# Patient Record
Sex: Male | Born: 1980 | Race: White | Hispanic: No | Marital: Married | Smoking: Former smoker
Health system: Southern US, Community
[De-identification: ages and names within clinical notes are randomized; demographics above are authoritative.]

## PROBLEM LIST (undated history)

## (undated) DIAGNOSIS — F840 Autistic disorder: Secondary | ICD-10-CM

## (undated) DIAGNOSIS — R7303 Prediabetes: Secondary | ICD-10-CM

## (undated) DIAGNOSIS — E119 Type 2 diabetes mellitus without complications: Secondary | ICD-10-CM

## (undated) DIAGNOSIS — Z9889 Other specified postprocedural states: Secondary | ICD-10-CM

## (undated) DIAGNOSIS — G4730 Sleep apnea, unspecified: Secondary | ICD-10-CM

## (undated) DIAGNOSIS — R112 Nausea with vomiting, unspecified: Secondary | ICD-10-CM

## (undated) DIAGNOSIS — I10 Essential (primary) hypertension: Secondary | ICD-10-CM

## (undated) DIAGNOSIS — R7989 Other specified abnormal findings of blood chemistry: Secondary | ICD-10-CM

## (undated) DIAGNOSIS — E221 Hyperprolactinemia: Secondary | ICD-10-CM

## (undated) DIAGNOSIS — F909 Attention-deficit hyperactivity disorder, unspecified type: Secondary | ICD-10-CM

## (undated) DIAGNOSIS — N200 Calculus of kidney: Secondary | ICD-10-CM

## (undated) DIAGNOSIS — K8020 Calculus of gallbladder without cholecystitis without obstruction: Secondary | ICD-10-CM

## (undated) DIAGNOSIS — K760 Fatty (change of) liver, not elsewhere classified: Secondary | ICD-10-CM

## (undated) DIAGNOSIS — E230 Hypopituitarism: Secondary | ICD-10-CM

## (undated) DIAGNOSIS — R000 Tachycardia, unspecified: Secondary | ICD-10-CM

## (undated) DIAGNOSIS — N62 Hypertrophy of breast: Secondary | ICD-10-CM

## (undated) DIAGNOSIS — F32A Depression, unspecified: Secondary | ICD-10-CM

## (undated) HISTORY — DX: Attention-deficit hyperactivity disorder, unspecified type: F90.9

## (undated) HISTORY — DX: Calculus of gallbladder without cholecystitis without obstruction: K80.20

## (undated) HISTORY — DX: Fatty (change of) liver, not elsewhere classified: K76.0

## (undated) HISTORY — PX: SMALL INTESTINE SURGERY: SHX150

## (undated) HISTORY — DX: Hyperprolactinemia: E22.1

## (undated) HISTORY — DX: Sleep apnea, unspecified: G47.30

## (undated) HISTORY — DX: Depression, unspecified: F32.A

## (undated) HISTORY — DX: Essential (primary) hypertension: I10

## (undated) HISTORY — DX: Hypertrophy of breast: N62

## (undated) HISTORY — DX: Other specified abnormal findings of blood chemistry: R79.89

## (undated) HISTORY — DX: Calculus of kidney: N20.0

## (undated) HISTORY — PX: CHOLECYSTECTOMY: SHX55

## (undated) HISTORY — DX: Autistic disorder: F84.0

## (undated) HISTORY — DX: Morbid (severe) obesity due to excess calories: E66.01

## (undated) HISTORY — DX: Hypopituitarism: E23.0

## (undated) HISTORY — DX: Type 2 diabetes mellitus without complications: E11.9

## (undated) HISTORY — DX: Prediabetes: R73.03

## (undated) HISTORY — DX: Tachycardia, unspecified: R00.0

---

## 2002-01-17 LAB — HM HEPATITIS C SCREENING LAB: HM Hepatitis Screen: NEGATIVE

## 2002-01-17 LAB — HM HIV SCREENING LAB: HM HIV Screening: NEGATIVE

## 2015-07-13 DIAGNOSIS — Z1389 Encounter for screening for other disorder: Secondary | ICD-10-CM | POA: Diagnosis not present

## 2015-07-13 DIAGNOSIS — F330 Major depressive disorder, recurrent, mild: Secondary | ICD-10-CM | POA: Diagnosis not present

## 2015-07-13 DIAGNOSIS — K219 Gastro-esophageal reflux disease without esophagitis: Secondary | ICD-10-CM | POA: Diagnosis not present

## 2015-08-11 DIAGNOSIS — F330 Major depressive disorder, recurrent, mild: Secondary | ICD-10-CM | POA: Diagnosis not present

## 2015-08-11 DIAGNOSIS — Z79899 Other long term (current) drug therapy: Secondary | ICD-10-CM | POA: Diagnosis not present

## 2015-08-19 DIAGNOSIS — Z0000 Encounter for general adult medical examination without abnormal findings: Secondary | ICD-10-CM | POA: Diagnosis not present

## 2015-08-19 DIAGNOSIS — Z1389 Encounter for screening for other disorder: Secondary | ICD-10-CM | POA: Diagnosis not present

## 2015-08-19 DIAGNOSIS — R5383 Other fatigue: Secondary | ICD-10-CM | POA: Diagnosis not present

## 2015-08-19 DIAGNOSIS — E669 Obesity, unspecified: Secondary | ICD-10-CM | POA: Diagnosis not present

## 2015-08-19 DIAGNOSIS — F4322 Adjustment disorder with anxiety: Secondary | ICD-10-CM | POA: Diagnosis not present

## 2015-09-08 DIAGNOSIS — R5383 Other fatigue: Secondary | ICD-10-CM | POA: Diagnosis not present

## 2015-09-08 DIAGNOSIS — F330 Major depressive disorder, recurrent, mild: Secondary | ICD-10-CM | POA: Diagnosis not present

## 2015-09-08 DIAGNOSIS — E291 Testicular hypofunction: Secondary | ICD-10-CM | POA: Diagnosis not present

## 2015-09-08 DIAGNOSIS — K219 Gastro-esophageal reflux disease without esophagitis: Secondary | ICD-10-CM | POA: Diagnosis not present

## 2015-09-12 DIAGNOSIS — E291 Testicular hypofunction: Secondary | ICD-10-CM | POA: Diagnosis not present

## 2015-09-20 DIAGNOSIS — F419 Anxiety disorder, unspecified: Secondary | ICD-10-CM | POA: Diagnosis not present

## 2015-09-22 DIAGNOSIS — M25571 Pain in right ankle and joints of right foot: Secondary | ICD-10-CM | POA: Diagnosis not present

## 2015-10-11 DIAGNOSIS — F331 Major depressive disorder, recurrent, moderate: Secondary | ICD-10-CM | POA: Diagnosis not present

## 2015-10-26 DIAGNOSIS — E291 Testicular hypofunction: Secondary | ICD-10-CM | POA: Diagnosis not present

## 2016-02-08 DIAGNOSIS — E291 Testicular hypofunction: Secondary | ICD-10-CM | POA: Diagnosis not present

## 2016-02-08 DIAGNOSIS — R05 Cough: Secondary | ICD-10-CM | POA: Diagnosis not present

## 2016-02-08 DIAGNOSIS — F330 Major depressive disorder, recurrent, mild: Secondary | ICD-10-CM | POA: Diagnosis not present

## 2016-02-08 DIAGNOSIS — R5383 Other fatigue: Secondary | ICD-10-CM | POA: Diagnosis not present

## 2016-02-08 DIAGNOSIS — K219 Gastro-esophageal reflux disease without esophagitis: Secondary | ICD-10-CM | POA: Diagnosis not present

## 2016-02-08 DIAGNOSIS — J029 Acute pharyngitis, unspecified: Secondary | ICD-10-CM | POA: Diagnosis not present

## 2016-02-08 DIAGNOSIS — M79672 Pain in left foot: Secondary | ICD-10-CM | POA: Diagnosis not present

## 2016-11-28 DIAGNOSIS — K219 Gastro-esophageal reflux disease without esophagitis: Secondary | ICD-10-CM | POA: Diagnosis not present

## 2016-11-28 DIAGNOSIS — Z79899 Other long term (current) drug therapy: Secondary | ICD-10-CM | POA: Diagnosis not present

## 2016-11-28 DIAGNOSIS — E291 Testicular hypofunction: Secondary | ICD-10-CM | POA: Diagnosis not present

## 2016-11-28 DIAGNOSIS — F330 Major depressive disorder, recurrent, mild: Secondary | ICD-10-CM | POA: Diagnosis not present

## 2016-12-07 DIAGNOSIS — M7710 Lateral epicondylitis, unspecified elbow: Secondary | ICD-10-CM | POA: Diagnosis not present

## 2017-03-01 DIAGNOSIS — K219 Gastro-esophageal reflux disease without esophagitis: Secondary | ICD-10-CM | POA: Diagnosis not present

## 2017-03-01 DIAGNOSIS — Z79899 Other long term (current) drug therapy: Secondary | ICD-10-CM | POA: Diagnosis not present

## 2017-03-01 DIAGNOSIS — F330 Major depressive disorder, recurrent, mild: Secondary | ICD-10-CM | POA: Diagnosis not present

## 2017-03-01 DIAGNOSIS — J329 Chronic sinusitis, unspecified: Secondary | ICD-10-CM | POA: Diagnosis not present

## 2017-03-01 DIAGNOSIS — E291 Testicular hypofunction: Secondary | ICD-10-CM | POA: Diagnosis not present

## 2018-07-24 DIAGNOSIS — K760 Fatty (change of) liver, not elsewhere classified: Secondary | ICD-10-CM | POA: Diagnosis not present

## 2018-07-24 DIAGNOSIS — I10 Essential (primary) hypertension: Secondary | ICD-10-CM | POA: Diagnosis not present

## 2018-07-24 DIAGNOSIS — J9811 Atelectasis: Secondary | ICD-10-CM | POA: Diagnosis not present

## 2018-07-24 DIAGNOSIS — R002 Palpitations: Secondary | ICD-10-CM | POA: Diagnosis not present

## 2018-07-24 DIAGNOSIS — R0602 Shortness of breath: Secondary | ICD-10-CM | POA: Diagnosis not present

## 2018-07-24 DIAGNOSIS — R0600 Dyspnea, unspecified: Secondary | ICD-10-CM | POA: Diagnosis not present

## 2018-07-24 DIAGNOSIS — K219 Gastro-esophageal reflux disease without esophagitis: Secondary | ICD-10-CM | POA: Diagnosis not present

## 2018-07-24 DIAGNOSIS — I4892 Unspecified atrial flutter: Secondary | ICD-10-CM | POA: Diagnosis not present

## 2018-07-28 DIAGNOSIS — I4892 Unspecified atrial flutter: Secondary | ICD-10-CM | POA: Diagnosis not present

## 2018-07-28 DIAGNOSIS — I517 Cardiomegaly: Secondary | ICD-10-CM

## 2018-07-29 DIAGNOSIS — I4892 Unspecified atrial flutter: Secondary | ICD-10-CM | POA: Diagnosis not present

## 2018-07-29 DIAGNOSIS — Z0000 Encounter for general adult medical examination without abnormal findings: Secondary | ICD-10-CM | POA: Diagnosis not present

## 2018-07-29 DIAGNOSIS — G4730 Sleep apnea, unspecified: Secondary | ICD-10-CM | POA: Diagnosis not present

## 2018-08-06 ENCOUNTER — Telehealth: Payer: Self-pay

## 2018-08-06 NOTE — Telephone Encounter (Signed)
Called patient to schedule new patient appt due to atrial flutter. Patient works nights and was instructed to call office back later today.

## 2018-08-14 NOTE — Progress Notes (Signed)
Cardiology Office Note:    Date:  08/15/2018   ID:  Peter Gutierrez, DOB 03/12/1980, MRN [id number]  PCP:  Peter CorporalYates, Kate H, PA  Cardiologist:  Peter HerrlichBrian Imani Sherrin, MD   Referring MD: Peter Gutierrez  ASSESSMENT:    1. Atrial flutter, unspecified type (HCC)   2. Cardiomyopathy, unspecified type (HCC)   3. BMI 45.0-49.9, adult (HCC)   4. Essential hypertension   5. Obstructive sleep apnea syndrome    PLAN:   I was able fortunately to access the office EKG independently reviewed it is sinus tachycardia otherwise normal the computer over read was indeed atrial flutter I discussed with patient that I do not think he has a primary problem with his heart rhythm I do not see any indication for ambulatory heart rhythm monitors antiarrhythmic drugs anticoagulation I think she remain on his beta-blocker which helps with hypertension control.  He voices understanding and agrees.  In order of problems listed above:  1. Plan trying to do more than anything else is obtained that initial EKG I doubt whether he had atrial flutter at this time I continue beta-blocker and I do not see any indication to consider antiarrhythmic drug anticoagulation refer to EP but I am doing my best to have the EKG in the office before the patient leaves. 2. I am unsure about the diagnosis of cardiomyopathy with the limits of his echocardiogram with morbid obesity repeat in my office with contrast.  Check proBNP level with next labs 3. He has morbid obesity with hypertension and sleep apnea would benefit from bariatric surgery I engaged in the discussion and told him that if medications do not work it is an appropriate treatment. 4. He has stage II hypertension and ARB 1 week check renal function potassium proBNP level 5. Untreated sleep apnea may well be responsible for all or most of his symptoms including fatigue edema exertional shortness of breath and new onset of hypertension.  Strongly encouraged him to reinitiate positive  pressure  Next appointment 4 weeks   Medication Adjustments/Labs and Tests Ordered: Current medicines are reviewed at length with the patient today.  Concerns regarding medicines are outlined above.  Orders Placed This Encounter  Procedures  . Basic Metabolic Panel (BMET)  . Pro b natriuretic peptide  . EKG 12-Lead  . ECHOCARDIOGRAM COMPLETE   Meds ordered this encounter  Medications  . telmisartan (MICARDIS) 20 MG tablet    Sig: Take 1 tablet (20 mg total) by mouth daily.    Dispense:  90 tablet    Refill:  1     Chief Complaint  Patient presents with  . Palpitations  . Abnormal ECG    " atrial flutter"  Chief Complaint: 38 yo male presents for consultation of atrial flutter.   History of Present Illness:    Peter Gutierrez is a 38 y.o. male who is being seen today for the evaluation of atrial flutter at the request of Peter Gutierrez. He has a past medical history of hypertension, sleep apnea (not on CPAP), obesity, and fatty liver. He has a strong family history of cardiovascular disease.   Seen by his PCP 07/24/18 for uncontrolled hypertension and palpitations. His EKG showed atrial flutter and he was sent to [facility] ED. That EKG unfortunately is unavailable for my review. In the ED: CTA negative for PE. CXR with possible atelectasis. Galbladder/liver ultrasound with enlarged fatty liver. EKG sinus tachycardia, independently reviewed by me. Seen again in PCP office 08/05/18.  Echo 07/28/18 with EF 45-50%, mild LVH, mild global hypokinesis of LV, mild AV sclerosis, mild MV calcification, trace MR/TR.   He is not doing well on life he finds himself quite fatigued he has severe sleep apnea and he has not started treatment because of cost.  He has elevated blood pressure but no diagnosis of hypertension and today's stage to my office and I will initiate an ARB in addition to his beta-blocker.  At times he notices his heart rate in the range of 110-120 and some palpitation  but not severe sustained was put on a beta-blocker and is markedly improved.  He short of breath when he climbs a few flights of stairs but not when he does physical labor he has edema at the end of the day but no orthopnea no chest pain or syncope.  He has no known history of congenital or rheumatic heart disease.  I reviewed his testing from [facility] at the end of the echo report it really says it was a difficult study to interpret and advised another means to assess left ventricular function.  In view of his body mass we will do an echocardiogram with contrast and I have him sitting in my office waiting to get the EKG that was reported as atrial flutter.  He also has a history of asthma but presently is not using bronchodilators and is on Trulicity for weight loss and what he reports to me is prediabetes  Past Medical History:  Diagnosis Date  . Fatty liver   . Hypertension   . Morbid obesity (HCC)   . Sleep apnea     Past Surgical History:  Procedure Laterality Date  . CHOLECYSTECTOMY      Current Medications: Current Meds  Medication Sig  . meloxicam (MOBIC) 15 MG tablet TAKE 1 TABLET BY MOUTH EVERY DAY AS NEEDED FOR PAIN  . metoprolol succinate (TOPROL-XL) 50 MG 24 hr tablet Take 50 mg by mouth daily.  . TRULICITY 0.75 MG/0.5ML SOPN 1 (ONE) SOLUTION PEN INJECTOR WEEKLY     Allergies:   Patient has no known allergies.   Social History   Socioeconomic History  . Marital status: Unknown    Spouse name: Not on file  . Number of children: Not on file  . Years of education: Not on file  . Highest education level: Not on file  Occupational History  . Not on file  Social Needs  . Financial resource strain: Not on file  . Food insecurity    Worry: Not on file    Inability: Not on file  . Transportation needs    Medical: Not on file    Non-medical: Not on file  Tobacco Use  . Smoking status: Former Smoker    Packs/day: 0.50    Years: 7.00    Pack years: 3.50     Types: Cigarettes    Quit date: 2011    Years since quitting: 9.5  . Smokeless tobacco: Current User  Substance and Sexual Activity  . Alcohol use: Yes    Comment: 7 beers per week  . Drug use: Not Currently  . Sexual activity: Not on file  Lifestyle  . Physical activity    Days per week: Not on file    Minutes per session: Not on file  . Stress: Not on file  Relationships  . Social Musicianconnections    Talks on phone: Not on file    Gets together: Not on file    Attends religious  service: Not on file    Active member of club or organization: Not on file    Attends meetings of clubs or organizations: Not on file    Relationship status: Not on file  Other Topics Concern  . Not on file  Social History Narrative  . Not on file     Family History: The patient's family history includes Arthritis in his father; Colon cancer in his maternal grandfather; Diabetes in his father and mother; Heart attack in his father and maternal grandfather; Heart disease in his father and maternal grandfather; Hyperlipidemia in his father and maternal grandfather; Hypertension in his father and maternal grandfather.  ROS:   Review of Systems  Constitution: Positive for malaise/fatigue.  HENT: Negative.   Eyes: Negative.   Cardiovascular: Positive for palpitations.  Respiratory: Positive for shortness of breath and snoring.   Endocrine: Negative.   Hematologic/Lymphatic: Negative.   Skin: Negative.   Musculoskeletal: Negative.   Gastrointestinal: Negative.   Genitourinary: Negative.   Neurological: Negative.   Psychiatric/Behavioral: Negative.   Allergic/Immunologic: Negative.    Please see the history of present illness.     All other systems reviewed and are negative.  EKGs/Labs/Other Studies Reviewed:    The following studies were reviewed today: CTA 08-22-18 - No PE. No cardiac abnormalities.  Galbladder [place] 08-22-2018 - Enlarged fatty liver. Cholecystectomy. Echocardiogram 08-22-18 CXR 08-22-2018  - "Minimal left basilar subsegmental atelectasis. No other active cardiopulmonary disease"  EKG:  EKG is  ordered today.  The ekg ordered today is personally reviewed and demonstrates shows sinus rhythm and is normal  Recent Labs:  08/05/18 creatinine 0.69, GFR 120, K 5.1, Na 145, Ca 9.4, AST 92, ALT 100, Hb 15.3A1c 6.2,  Recent Lipid Panel 08/05/18 total 104 HDL 38 LDL 54 Triglycerides 61  Physical Exam:    VS:  BP (!) 152/110 (BP Location: Left Arm, Patient Position: Sitting, Cuff Size: Large)   Pulse 85   Temp 98.4 F (36.9 C)   Ht 6\' 3"  (1.905 m)   Wt (!) 373 lb 3.2 oz (169.3 kg)   SpO2 97%   BMI 46.65 kg/m     Wt Readings from Last 3 Encounters:  08/15/18 (!) 373 lb 3.2 oz (169.3 kg)     GEN: Morbid obesity but in general he is just a big man well nourished, well developed in no acute distress HEENT: Normal NECK: No JVD; No carotid bruits LYMPHATICS: No lymphadenopathy CARDIAC: RRR, no murmurs, rubs, gallops RESPIRATORY:  Clear to auscultation without rales, wheezing or rhonchi  ABDOMEN: Soft, non-tender, non-distended MUSCULOSKELETAL:  No edema; No deformity  SKIN: Warm and dry NEUROLOGIC:  Alert and oriented x 3 PSYCHIATRIC:  Normal affect     Signed, Shirlee More, MD  08/15/2018 11:20 AM    [place]

## 2018-08-15 ENCOUNTER — Ambulatory Visit (INDEPENDENT_AMBULATORY_CARE_PROVIDER_SITE_OTHER): Payer: Managed Care, Other (non HMO) | Admitting: Cardiology

## 2018-08-15 ENCOUNTER — Other Ambulatory Visit: Payer: Self-pay

## 2018-08-15 ENCOUNTER — Encounter: Payer: Self-pay | Admitting: Cardiology

## 2018-08-15 VITALS — BP 152/110 | HR 85 | Temp 98.4°F | Ht 75.0 in | Wt 373.2 lb

## 2018-08-15 DIAGNOSIS — G4730 Sleep apnea, unspecified: Secondary | ICD-10-CM | POA: Insufficient documentation

## 2018-08-15 DIAGNOSIS — I429 Cardiomyopathy, unspecified: Secondary | ICD-10-CM | POA: Diagnosis not present

## 2018-08-15 DIAGNOSIS — I10 Essential (primary) hypertension: Secondary | ICD-10-CM | POA: Diagnosis not present

## 2018-08-15 DIAGNOSIS — G4733 Obstructive sleep apnea (adult) (pediatric): Secondary | ICD-10-CM

## 2018-08-15 DIAGNOSIS — I4892 Unspecified atrial flutter: Secondary | ICD-10-CM | POA: Diagnosis not present

## 2018-08-15 DIAGNOSIS — Z6841 Body Mass Index (BMI) 40.0 and over, adult: Secondary | ICD-10-CM | POA: Diagnosis not present

## 2018-08-15 MED ORDER — TELMISARTAN 20 MG PO TABS: 20.0000 mg | ORAL_TABLET | Freq: Every day | ORAL | 1 refills | Status: DC

## 2018-08-15 NOTE — Patient Instructions (Addendum)
Medication Instructions:  Your physician has recommended you make the following change in your medication:   START: Telmisartan 20 mg: Take 1 tab daily   If you need a refill on your cardiac medications before your next appointment, please call your pharmacy.   Lab work: Your physician recommends that you return for lab work in: 1 week BMP,Pro BNP  If you have labs (blood work) drawn today and your tests are completely normal, you will receive your results only by: Marland Kitchen. MyChart Message (if you have MyChart) OR . A paper copy in the mail If you have any lab test that is abnormal or we need to change your treatment, we will call you to review the results.  Testing/Procedures: Your physician has requested that you have an echocardiogram. Echocardiography is a painless test that uses sound waves to create images of your heart. It provides your doctor with information about the size and shape of your heart and how well your heart's chambers and valves are working. This procedure takes approximately one hour. There are no restrictions for this procedure.    Follow-Up: At [facility], you and your health needs are our priority.  As part of our continuing mission to provide you with exceptional heart care, we have created designated Provider Care Teams.  These Care Teams include your primary Cardiologist (physician) and Advanced Practice Providers (APPs -  Physician Assistants and Nurse Practitioners) who all work together to provide you with the care you need, when you need it. You will need a follow up appointment in 4 weeks.   Any Other Special Instructions Will Be Listed Below (If Applicable).   Echocardiogram An echocardiogram is a procedure that uses painless sound waves (ultrasound) to produce an image of the heart. Images from an echocardiogram can provide important information about:  Signs of coronary artery disease (CAD).  Aneurysm detection. An aneurysm is a weak or damaged part  of an artery wall that bulges out from the normal force of blood pumping through the body.  Heart size and shape. Changes in the size or shape of the heart can be associated with certain conditions, including heart failure, aneurysm, and CAD.  Heart muscle function.  Heart valve function.  Signs of a past heart attack.  Fluid buildup around the heart.  Thickening of the heart muscle.  A tumor or infectious growth around the heart valves. Tell a health care provider about:  Any allergies you have.  All medicines you are taking, including vitamins, herbs, eye drops, creams, and over-the-counter medicines.  Any blood disorders you have.  Any surgeries you have had.  Any medical conditions you have.  Whether you are pregnant or may be pregnant. What are the risks? Generally, this is a safe procedure. However, problems may occur, including:  Allergic reaction to dye (contrast) that may be used during the procedure. What happens before the procedure? No specific preparation is needed. You may eat and drink normally. What happens during the procedure?   An IV tube may be inserted into one of your veins.  You may receive contrast through this tube. A contrast is an injection that improves the quality of the pictures from your heart.  A gel will be applied to your chest.  A wand-like tool (transducer) will be moved over your chest. The gel will help to transmit the sound waves from the transducer.  The sound waves will harmlessly bounce off of your heart to allow the heart images to be captured in  real-time motion. The images will be recorded on a computer. The procedure may vary among health care providers and hospitals. What happens after the procedure?  You may return to your normal, everyday life, including diet, activities, and medicines, unless your health care provider tells you not to do that. Summary  An echocardiogram is a procedure that uses painless sound waves  (ultrasound) to produce an image of the heart.  Images from an echocardiogram can provide important information about the size and shape of your heart, heart muscle function, heart valve function, and fluid buildup around your heart.  You do not need to do anything to prepare before this procedure. You may eat and drink normally.  After the echocardiogram is completed, you may return to your normal, everyday life, unless your health care provider tells you not to do that. This information is not intended to replace advice given to you by your health care provider. Make sure you discuss any questions you have with your health care provider. Document Released: 01/13/2000 Document Revised: 05/08/2018 Document Reviewed: 02/18/2016 Elsevier Patient Education  [place].  Telmisartan tablets What is this medicine? TELMISARTAN (tel mi SAR tan) is used to treat high blood pressure. This medicine may be used for other purposes; ask your health care provider or pharmacist if you have questions. COMMON BRAND NAME(S): Micardis What should I tell my health care provider before I take this medicine? They need to know if you have any of these conditions:  if you are on a special diet, such as a low-salt diet  kidney or liver disease  an unusual or allergic reaction to telmisartan, other medicines, foods, dyes, or preservatives  pregnant or trying to get pregnant  breast-feeding How should I use this medicine? Take this medicine by mouth with a glass of water. Follow the directions on the prescription label. This medicine can be taken with or without food. Take your doses at regular intervals. Do not take your medicine more often than directed. Talk to your pediatrician regarding the use of this medicine in children. Special care may be needed. Overdosage: If you think you have taken too much of this medicine contact a poison control center or emergency room at once. NOTE: This medicine is  only for you. Do not share this medicine with others. What if I miss a dose? If you miss a dose, take it as soon as you can. If it is almost time for your next dose, take only that dose. Do not take double or extra doses. What may interact with this medicine?  digoxin  potassium salts or potassium supplements  warfarin This list may not describe all possible interactions. Give your health care provider a list of all the medicines, herbs, non-prescription drugs, or dietary supplements you use. Also tell them if you smoke, drink alcohol, or use illegal drugs. Some items may interact with your medicine. What should I watch for while using this medicine? Visit your doctor or health care professional for regular checks on your progress. Check your blood pressure as directed. Ask your doctor or health care professional what your blood pressure should be and when you should contact him or her. Call your doctor or health care professional if you notice an irregular or fast heart beat. Women should inform their doctor if they wish to become pregnant or think they might be pregnant. There is a potential for serious side effects to an unborn child, particularly in the second or third trimester. Talk to  your health care professional or pharmacist for more information. You may get drowsy or dizzy. Do not drive, use machinery, or do anything that needs mental alertness until you know how this drug affects you. Do not stand or sit up quickly, especially if you are an older patient. This reduces the risk of dizzy or fainting spells. Alcohol can make you more drowsy and dizzy. Avoid alcoholic drinks. Avoid salt substitutes unless you are told otherwise by your doctor or health care professional. Do not treat yourself for coughs, colds, or pain while you are taking this medicine without asking your doctor or health care professional for advice. Some ingredients may increase your blood pressure. What side effects may  I notice from receiving this medicine? Side effects that you should report to your doctor or health care professional as soon as possible:  allergic reactions like skin rash, itching or hives, swelling of the face, lips, or tongue  breathing problems  dark urine  gout pain  muscle pains  slow heartbeat  trouble passing urine or change in the amount of urine  unusual bleeding or bruising  yellowing of the eyes or skin Side effects that usually do not require medical attention (report to your doctor or health care professional if they continue or are bothersome):  back pain  change in sex drive or performance  diarrhea  sore throat or stuffy nose This list may not describe all possible side effects. Call your doctor for medical advice about side effects. You may report side effects to FDA at 1-800-FDA-1088. Where should I keep my medicine? Keep out of the reach of children. Store at room temperature between 15 and 30 degrees C (59 and 86 degrees F). Tablets should not be removed from the blisters until right before use. Throw away any unused medicine after the expiration date. NOTE: This sheet is a summary. It may not cover all possible information. If you have questions about this medicine, talk to your doctor, pharmacist, or health care provider.  2020 Elsevier/Gold Standard (2007-04-02 13:39:10)

## 2018-09-26 ENCOUNTER — Other Ambulatory Visit: Payer: Managed Care, Other (non HMO)

## 2018-10-02 ENCOUNTER — Ambulatory Visit (INDEPENDENT_AMBULATORY_CARE_PROVIDER_SITE_OTHER): Payer: Managed Care, Other (non HMO)

## 2018-10-02 ENCOUNTER — Other Ambulatory Visit: Payer: Self-pay

## 2018-10-02 DIAGNOSIS — I429 Cardiomyopathy, unspecified: Secondary | ICD-10-CM | POA: Diagnosis not present

## 2018-10-02 NOTE — Progress Notes (Signed)
Cardiology Office Note:    Date:  10/03/2018   ID:  Peter Gutierrez, DOB Oct 24, 1980, MRN [id number]  PCP:  Renaldo Reel, PA  Cardiologist:  Shirlee More, MD    Referring MD: Renaldo Reel, [place]    ASSESSMENT:    1. Hypertensive heart disease without heart failure   2. Obstructive sleep apnea syndrome    PLAN:    In order of problems listed above:  1. Improved BP at target continue current treatment ARB beta-blocker 2. Atrial flutter, reviewed the EKG with sinus tachycardia I would not consider an antiarrhythmic drug EP referral anticoagulation. 3. Sleep apnea in process of evaluation treatment 4. Morbid obesity motivated for weight loss.   Next appointment: See back in the office as needed   Medication Adjustments/Labs and Tests Ordered: Current medicines are reviewed at length with the patient today.  Concerns regarding medicines are outlined above.  No orders of the defined types were placed in this encounter.  No orders of the defined types were placed in this encounter.   No chief complaint on file.   History of Present Illness:    Peter Gutierrez is a 38 y.o. male with a hx of hypertension, sleep apnea (not on CPAP), obesity, and fatty liver  last seen 08/15/2018 for atrial flutter. Seen by his PCP 07/24/18 for uncontrolled hypertension and palpitations. His EKG showed atrial flutter and he was sent to [facility] ED. That EKG unfortunately is unavailable for my review. In the ED: CTA negative for PE. CXR with possible atelectasis. Galbladder/liver ultrasound with enlarged fatty liver. EKG sinus tachycardia, independently reviewed by me. Seen again in PCP office 08/05/18.   Echo 07/28/18 with EF 45-50%, mild LVH, mild global hypokinesis of LV, mild AV sclerosis, mild MV calcification, trace MR/TR.   Compliance with diet, lifestyle and medications: Yes Home blood pressure runs less than 130 145/80.  He started taking Trulicity and had a great deal of GI upset is  improved.  He is in the process of qualifying for CPAP for sleep apnea is motivated for weight loss with morbid obesity.  An echocardiogram done yesterday in the [place] office unfortunately was not interpreted broke from the visit long been looked at the study and he has normal left ventricular function.  He has no edema shortness of breath chest pain palpitation or syncope and tolerates his antihypertensive meds. Past Medical History:  Diagnosis Date  . Fatty liver   . Hypertension   . Morbid obesity ([place])   . Sleep apnea     Past Surgical History:  Procedure Laterality Date  . CHOLECYSTECTOMY      Current Medications: Current Meds  Medication Sig  . meloxicam (MOBIC) 15 MG tablet TAKE 1 TABLET BY MOUTH EVERY DAY AS NEEDED FOR PAIN  . metoprolol succinate (TOPROL-XL) 50 MG 24 hr tablet Take 50 mg by mouth daily.  Marland Kitchen omeprazole (PRILOSEC) 20 MG capsule Take 1 capsule by mouth daily.  Marland Kitchen telmisartan (MICARDIS) 20 MG tablet Take 1 tablet (20 mg total) by mouth daily.  . TRULICITY 7.37 TG/6.2IR SOPN 1 (ONE) SOLUTION PEN INJECTOR WEEKLY     Allergies:   Patient has no known allergies.   Social History   Socioeconomic History  . Marital status: Unknown    Spouse name: Not on file  . Number of children: Not on file  . Years of education: Not on file  . Highest education level: Not on file  Occupational History  . Not on  file  Social Needs  . Financial resource strain: Not on file  . Food insecurity    Worry: Not on file    Inability: Not on file  . Transportation needs    Medical: Not on file    Non-medical: Not on file  Tobacco Use  . Smoking status: Former Smoker    Packs/day: 0.50    Years: 7.00    Pack years: 3.50    Types: Cigarettes    Quit date: 2011    Years since quitting: 9.6  . Smokeless tobacco: Current User  Substance and Sexual Activity  . Alcohol use: Yes    Comment: 7 beers per week  . Drug use: Not Currently  . Sexual activity: Not on file   Lifestyle  . Physical activity    Days per week: Not on file    Minutes per session: Not on file  . Stress: Not on file  Relationships  . Social Musicianconnections    Talks on phone: Not on file    Gets together: Not on file    Attends religious service: Not on file    Active member of club or organization: Not on file    Attends meetings of clubs or organizations: Not on file    Relationship status: Not on file  Other Topics Concern  . Not on file  Social History Narrative  . Not on file     Family History: The patient's family history includes Arthritis in his father; Colon cancer in his maternal grandfather; Diabetes in his father and mother; Heart attack in his father and maternal grandfather; Heart disease in his father and maternal grandfather; Hyperlipidemia in his father and maternal grandfather; Hypertension in his father and maternal grandfather. ROS:   Please see the history of present illness.    All other systems reviewed and are negative.  EKGs/Labs/Other Studies Reviewed:    The following studies were reviewed today: I reviewed his echocardiogram from yesterday at noon today I will generate the report he has normal left ventricular ejection fraction   Physical Exam:    VS:  BP (!) 134/96 (BP Location: Left Arm, Patient Position: Sitting, Cuff Size: Large)   Pulse 84   Ht 6\' 3"  (1.905 m)   Wt (!) 371 lb 12.8 oz (168.6 kg)   SpO2 97%   BMI 46.47 kg/m     Wt Readings from Last 3 Encounters:  10/03/18 (!) 371 lb 12.8 oz (168.6 kg)  08/15/18 (!) 373 lb 3.2 oz (169.3 kg)    Repeat blood pressure by me large cuff right arm sitting 138/86 GEN:  Well nourished, well developed in no acute distress HEENT: Normal NECK: No JVD; No carotid bruits LYMPHATICS: No lymphadenopathy CARDIAC: RRR, no murmurs, rubs, gallops RESPIRATORY:  Clear to auscultation without rales, wheezing or rhonchi  ABDOMEN: Soft, non-tender, non-distended MUSCULOSKELETAL:  No edema; No deformity   SKIN: Warm and dry NEUROLOGIC:  Alert and oriented x 3 PSYCHIATRIC:  Normal affect    Signed, Norman HerrlichBrian Munley, MD  10/03/2018 10:50 AM    [facility]

## 2018-10-02 NOTE — Progress Notes (Signed)
Complete echocardiogram has been performed.  Jimmy Nielle Duford RDCS, RVT 

## 2018-10-03 ENCOUNTER — Encounter: Payer: Self-pay | Admitting: Cardiology

## 2018-10-03 ENCOUNTER — Ambulatory Visit (INDEPENDENT_AMBULATORY_CARE_PROVIDER_SITE_OTHER): Payer: Managed Care, Other (non HMO) | Admitting: Cardiology

## 2018-10-03 VITALS — BP 134/96 | HR 84 | Ht 75.0 in | Wt 371.8 lb

## 2018-10-03 DIAGNOSIS — I119 Hypertensive heart disease without heart failure: Secondary | ICD-10-CM

## 2018-10-03 DIAGNOSIS — G4733 Obstructive sleep apnea (adult) (pediatric): Secondary | ICD-10-CM

## 2018-10-03 NOTE — Patient Instructions (Signed)
Medication Instructions:  Your physician recommends that you continue on your current medications as directed. Please refer to the Current Medication list given to you today.  If you need a refill on your cardiac medications before your next appointment, please call your pharmacy.   Lab work: None If you have labs (blood work) drawn today and your tests are completely normal, you will receive your results only by: Marland Kitchen MyChart Message (if you have MyChart) OR . A paper copy in the mail If you have any lab test that is abnormal or we need to change your treatment, we will call you to review the results.  Testing/Procedures: None  Follow-Up: At [facility], you and your health needs are our priority.  As part of our continuing mission to provide you with exceptional heart care, we have created designated Provider Care Teams.  These Care Teams include your primary Cardiologist (physician) and Advanced Practice Providers (APPs -  Physician Assistants and Nurse Practitioners) who all work together to provide you with the care you need, when you need it. You will need a follow up appointment in as needed with Peter Gutierrez Any Other Special Instructions Will Be Listed Below (If Applicable).

## 2019-02-04 ENCOUNTER — Other Ambulatory Visit: Payer: Self-pay | Admitting: Cardiology

## 2019-07-22 ENCOUNTER — Other Ambulatory Visit: Payer: Self-pay | Admitting: Urology

## 2019-07-23 ENCOUNTER — Other Ambulatory Visit (HOSPITAL_COMMUNITY)
Admission: RE | Admit: 2019-07-23 | Discharge: 2019-07-23 | Disposition: A | Discharge status: Home / Self Care | Payer: 59 | Source: Ambulatory Visit | Attending: Urology | Admitting: Urology

## 2019-07-23 DIAGNOSIS — Z20822 Contact with and (suspected) exposure to covid-19: Secondary | ICD-10-CM | POA: Insufficient documentation

## 2019-07-23 DIAGNOSIS — Z01812 Encounter for preprocedural laboratory examination: Secondary | ICD-10-CM | POA: Insufficient documentation

## 2019-07-23 LAB — SARS CORONAVIRUS 2 (TAT 6-24 HRS): SARS Coronavirus 2: NEGATIVE

## 2019-07-24 NOTE — Progress Notes (Signed)
Talked with pt arrival time 47. Npo after MN except sip of water with AM meds. Wife is driver. Instructions given

## 2019-07-24 NOTE — H&P (Signed)
Office Visit Report     07/21/2019   --------------------------------------------------------------------------------   Peter Gutierrez  MRN: [id number]  DOB: [birth year], 39 year old Male  SSN:    PRIMARY CARE:  Peter Gutierrez, [place]  REFERRING:    PROVIDER:  Alexis Gutierrez, M.D.  LOCATION:  [facility] [phone]     --------------------------------------------------------------------------------   CC/HPI: 1 - Recurrent Urolithiasis -  Pre 2021 - MET x several  06/2019 - 2m Rt proximal stone at L2 by ER CT 6/19 in [place]. SSD 18cm KUB visible. UA w/o infectios paramaters. NO additional stones, contralateral kidney unremarkable.   2 - Medical STone Disease -  Eval 2021 - BMP, PTH, Urate - pending; Composition - pending; 24 Hr Urines - pending.   PMH sig for obesity, lap chole, open intestinal surgyer as infant. He works at [employer] His wife is pharmacist at [employer]   Today " Peter Gutierrez" is seen as new patient work in for right proximal stone.     ALLERGIES: None   MEDICATIONS: Aspirin  Metoprolol Tartrate 50 mg tablet  Omeprazole 20 mg capsule,delayed release  Flomax 0.4 mg capsule  Meloxicam 15 mg tablet  Omega 3  Oxycodone Hcl 5 mg capsule  Telmisartan 80 mg tablet     GU PSH: None     PSH Notes: Gastrointestinal Proc - 1981   NON-GU PSH: Cholecystectomy (laparoscopic) - 2007     GU PMH: None   NON-GU PMH: Anxiety Depression GERD Hypertension Sleep Apnea    FAMILY HISTORY: 3 daughters - Daughter Heart Attack - Father Hypertension - Father Kidney Stones - Mother   SOCIAL HISTORY: Marital Status: Married Preferred Language: English; Ethnicity: Not Hispanic Or Latino; Race: White Current Smoking Status: Patient does not smoke anymore. Has not smoked since 06/30/2007. Smoked for 7 years. Smoked 1 pack per day.   Tobacco Use Assessment Completed: Used Tobacco in last 30 days? Drinks 1 drink per day.  Drinks 1 caffeinated  drink per day. 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 Patient denies burning/ pain with urination, leakage of urine, stream starts and stops, trouble starting your stream, have to strain to urinate , and penile pain.  Gastrointestinal (Upper):   Patient reports nausea and vomiting. Patient denies indigestion/ heartburn.  Gastrointestinal (Lower):   Patient reports constipation. Patient denies diarrhea.  Constitutional:   Patient reports night sweats and fatigue. Patient denies fever and weight loss.  Skin:   Patient denies skin rash/ lesion and itching.  Eyes:   Patient denies blurred vision and double vision.  Ears/ Nose/ Throat:   Patient denies sore throat and sinus problems.  Hematologic/Lymphatic:   Patient denies swollen glands and easy bruising.  Cardiovascular:   Patient denies leg swelling and chest pains.  Respiratory:   Patient reports shortness of breath. Patient denies cough.  Endocrine:   Patient reports excessive thirst.   Musculoskeletal:   Patient denies back pain and joint pain.  Neurological:   Patient denies headaches and dizziness.  Psychologic:   Patient reports anxiety. Patient denies depression.   VITAL SIGNS:      07/21/2019 09:12 AM  Weight 386.5 lb / 175.31 kg  Height 75 in / 190.5 cm  BP 140/99 mmHg  Pulse 82 /min  Temperature 98.2 F / 36.7 C  BMI 48.3 kg/m   GU PHYSICAL EXAMINATION:    Anus and Perineum: No hemorrhoids. No anal stenosis. No rectal fissure, no anal fissure. No edema, no dimple, no perineal tenderness, no anal tenderness.  Scrotum: No lesions. No edema. No cysts. No warts.  Epididymides: Right: no spermatocele, no masses, no cysts, no tenderness, no induration, no enlargement. Left: no spermatocele, no masses, no cysts, no tenderness, no induration, no enlargement.   Testes: No tenderness, no swelling, no enlargement left testes. No tenderness, no swelling, no enlargement right testes. Normal location left testes. Normal location right testes. No mass, no cyst, no varicocele, no hydrocele left testes. No mass, no cyst, no varicocele, no hydrocele right testes.  Urethral Meatus: Normal size. No lesion, no wart, no discharge, no polyp. Normal location.  Penis: Circumcised, no warts, no cracks. No dorsal Peyronie's plaques, no left corporal Peyronie's plaques, no right corporal Peyronie's plaques, no scarring, no warts. No balanitis, no meatal stenosis.  Seminal Vesicles: Nonpalpable.   MULTI-SYSTEM PHYSICAL EXAMINATION:    Constitutional: Well-nourished. No physical deformities. Normally developed. Good grooming.  Neck: Neck symmetrical, not swollen. Normal tracheal position.  Respiratory: No labored breathing, no use of accessory muscles.   Cardiovascular: Normal temperature, normal extremity pulses, no swelling, no varicosities.  Lymphatic: No enlargement of neck, axillae, groin.  Skin: No paleness, no jaundice, no cyanosis. No lesion, no ulcer, no rash.  Neurologic / Psychiatric: Oriented to time, oriented to place, oriented to person. No depression, no anxiety, no agitation.  Gastrointestinal: No mass, no tenderness, no rigidity, moderate truncal obesity. Prior scars w/o hernias.   Eyes: Normal conjunctivae. Normal eyelids.  Ears, Nose, Mouth, and Throat: Left ear no scars, no lesions, no masses. Right ear no scars, no lesions, no masses. Nose no scars, no lesions, no masses. Normal hearing. Normal lips.  Musculoskeletal: Normal gait and station of head and neck.     Complexity of Data:  X-Ray Review: C.T. Abdomen/Pelvis: Reviewed Films. Reviewed Report. Discussed With Patient.     PROCEDURES:         KUB - [id number]  A single view of the abdomen is obtained.      Patient confirmed No Neulasta OnPro Device.           Urinalysis w/Scope Dipstick  Dipstick Cont'd Micro  Color: Yellow Bilirubin: Neg mg/dL WBC/hpf: 0 - 5/hpf  Appearance: Clear Ketones: Neg mg/dL RBC/hpf: 3 - 10/hpf  Specific Gravity: 1.025 Blood: 2+ ery/uL Bacteria: NS (Not Seen)  pH: 5.5 Protein: Neg mg/dL Cystals: NS (Not Seen)  Glucose: Neg mg/dL Urobilinogen: 0.2 mg/dL Casts: NS (Not Seen)    Nitrites: Neg Trichomonas: Not Present    Leukocyte Esterase: Trace leu/uL Mucous: Present      Epithelial Cells: NS (Not Seen)      Yeast: NS (Not Seen)      Sperm: Not Present    ASSESSMENT:      ICD-10 Details  1 GU:   Ureteral calculus - N20.1 Right, Undiagnosed New Problem   PLAN:            Medications New Meds: Percocet 5 mg-325 mg tablet 1-2 tablet PO Q6PRN Pain from kidney stone  #30  0 Refill(s)            Orders Labs Hypercalciura Profile  X-Rays: KUB          Schedule         Document Letter(s):  Created for Patient: Clinical Summary  Notes:   Discused options of medical therapy (<30% chance passage), SWL (80% chance of passage as large SSD), or URS (95% chance of passage, but may requrie staged at his young age). He opts for Rt SWL. Will stop NSAIDS now. Percocet for interval.   Risks, benefits, expected peri-treatment course discussed.   BMP, PTH, URate today    * Signed by Peter Gutierrez, M.D. on 07/21/19 at 10:06 AM (EDT)*

## 2019-07-27 ENCOUNTER — Other Ambulatory Visit: Payer: Self-pay

## 2019-07-27 ENCOUNTER — Ambulatory Visit (HOSPITAL_BASED_OUTPATIENT_CLINIC_OR_DEPARTMENT_OTHER)
Admission: RE | Admit: 2019-07-27 | Discharge: 2019-07-27 | Disposition: A | Discharge status: Home / Self Care | Payer: 59 | Attending: Urology | Admitting: Urology

## 2019-07-27 ENCOUNTER — Encounter (HOSPITAL_BASED_OUTPATIENT_CLINIC_OR_DEPARTMENT_OTHER): Payer: Self-pay | Admitting: Urology

## 2019-07-27 ENCOUNTER — Encounter (HOSPITAL_BASED_OUTPATIENT_CLINIC_OR_DEPARTMENT_OTHER)
Admission: RE | Disposition: A | Discharge status: Home / Self Care | Payer: Self-pay | Source: Home / Self Care | Attending: Urology

## 2019-07-27 ENCOUNTER — Ambulatory Visit (HOSPITAL_COMMUNITY): Payer: 59

## 2019-07-27 DIAGNOSIS — Z8249 Family history of ischemic heart disease and other diseases of the circulatory system: Secondary | ICD-10-CM | POA: Insufficient documentation

## 2019-07-27 DIAGNOSIS — Z87891 Personal history of nicotine dependence: Secondary | ICD-10-CM | POA: Insufficient documentation

## 2019-07-27 DIAGNOSIS — F329 Major depressive disorder, single episode, unspecified: Secondary | ICD-10-CM | POA: Diagnosis not present

## 2019-07-27 DIAGNOSIS — Z6841 Body Mass Index (BMI) 40.0 and over, adult: Secondary | ICD-10-CM | POA: Diagnosis not present

## 2019-07-27 DIAGNOSIS — Z7982 Long term (current) use of aspirin: Secondary | ICD-10-CM | POA: Insufficient documentation

## 2019-07-27 DIAGNOSIS — Z9049 Acquired absence of other specified parts of digestive tract: Secondary | ICD-10-CM | POA: Diagnosis not present

## 2019-07-27 DIAGNOSIS — G4730 Sleep apnea, unspecified: Secondary | ICD-10-CM | POA: Diagnosis not present

## 2019-07-27 DIAGNOSIS — Z791 Long term (current) use of non-steroidal anti-inflammatories (NSAID): Secondary | ICD-10-CM | POA: Insufficient documentation

## 2019-07-27 DIAGNOSIS — Z841 Family history of disorders of kidney and ureter: Secondary | ICD-10-CM | POA: Diagnosis not present

## 2019-07-27 DIAGNOSIS — N200 Calculus of kidney: Secondary | ICD-10-CM | POA: Diagnosis present

## 2019-07-27 DIAGNOSIS — Z79899 Other long term (current) drug therapy: Secondary | ICD-10-CM | POA: Diagnosis not present

## 2019-07-27 DIAGNOSIS — E669 Obesity, unspecified: Secondary | ICD-10-CM | POA: Insufficient documentation

## 2019-07-27 DIAGNOSIS — N201 Calculus of ureter: Secondary | ICD-10-CM | POA: Insufficient documentation

## 2019-07-27 DIAGNOSIS — I10 Essential (primary) hypertension: Secondary | ICD-10-CM | POA: Insufficient documentation

## 2019-07-27 HISTORY — DX: Other specified postprocedural states: R11.2

## 2019-07-27 HISTORY — DX: Other specified postprocedural states: Z98.890

## 2019-07-27 HISTORY — PX: EXTRACORPOREAL SHOCK WAVE LITHOTRIPSY: SHX1557

## 2019-07-27 LAB — GLUCOSE, CAPILLARY: Glucose-Capillary: 131 mg/dL — ABNORMAL HIGH (ref 70–99)

## 2019-07-27 SURGERY — LITHOTRIPSY, ESWL
Anesthesia: LOCAL | Laterality: Right

## 2019-07-27 MED ORDER — DIAZEPAM 5 MG PO TABS
10.0000 mg | ORAL_TABLET | ORAL | Status: AC
  Administered 2019-07-27: 10 mg via ORAL

## 2019-07-27 MED ORDER — CIPROFLOXACIN HCL 500 MG PO TABS
ORAL_TABLET | ORAL | Status: AC
  Filled 2019-07-27: qty 1

## 2019-07-27 MED ORDER — OXYCODONE-ACETAMINOPHEN 5-325 MG PO TABS: 1.0000 | ORAL_TABLET | ORAL | 0 refills | Status: DC | PRN

## 2019-07-27 MED ORDER — SODIUM CHLORIDE 0.9 % IV SOLN: INTRAVENOUS | Status: DC

## 2019-07-27 MED ORDER — CIPROFLOXACIN HCL 500 MG PO TABS
500.0000 mg | ORAL_TABLET | ORAL | Status: AC
  Administered 2019-07-27: 500 mg via ORAL

## 2019-07-27 MED ORDER — DIAZEPAM 5 MG PO TABS
ORAL_TABLET | ORAL | Status: AC
  Filled 2019-07-27: qty 2

## 2019-07-27 MED ORDER — DIPHENHYDRAMINE HCL 25 MG PO CAPS
ORAL_CAPSULE | ORAL | Status: AC
  Filled 2019-07-27: qty 1

## 2019-07-27 MED ORDER — DIPHENHYDRAMINE HCL 25 MG PO CAPS
25.0000 mg | ORAL_CAPSULE | ORAL | Status: AC
  Administered 2019-07-27: 25 mg via ORAL

## 2019-07-27 NOTE — Discharge Instructions (Addendum)
1. You should strain your urine and collect all fragments and bring them to your follow up appointment.  °2. You should take your pain medication as needed.  Please call if your pain is severe to the point that it is not controlled with your pain medication. °3. You should call if you develop fever > 101 or persistent nausea or vomiting. °4. Your doctor may prescribe tamsulosin to take to help facilitate stone passage. °

## 2019-07-27 NOTE — Op Note (Signed)
See [facility] operative note scanned into chart. Also because of the size, density, location and other factors that cannot be anticipated I feel this will likely be a staged procedure. This fact supersedes any indication in the scanned [facility] operative note to the contrary.  

## 2019-07-27 NOTE — Interval H&P Note (Signed)
History and Physical Interval Note:  07/27/2019 8:02 AM  Peter Gutierrez  has presented today for surgery, with the diagnosis of RIGHT URETERAL STONE.  The various methods of treatment have been discussed with the patient and family. After consideration of risks, benefits and other options for treatment, the patient has consented to  Procedure(s): EXTRACORPOREAL SHOCK WAVE LITHOTRIPSY (ESWL) (Right) as a surgical intervention.  The patient's history has been reviewed, patient examined, no change in status, stable for surgery.  I have reviewed the patient's chart and labs.  Questions were answered to the patient's satisfaction.     [employer]

## 2019-07-28 ENCOUNTER — Encounter (HOSPITAL_BASED_OUTPATIENT_CLINIC_OR_DEPARTMENT_OTHER): Payer: Self-pay | Admitting: Urology

## 2019-08-24 ENCOUNTER — Other Ambulatory Visit: Payer: Self-pay | Admitting: Family Medicine

## 2019-08-24 DIAGNOSIS — R930 Abnormal findings on diagnostic imaging of skull and head, not elsewhere classified: Secondary | ICD-10-CM

## 2021-01-04 DIAGNOSIS — K219 Gastro-esophageal reflux disease without esophagitis: Secondary | ICD-10-CM | POA: Diagnosis not present

## 2021-01-04 DIAGNOSIS — G4730 Sleep apnea, unspecified: Secondary | ICD-10-CM | POA: Diagnosis not present

## 2021-01-04 DIAGNOSIS — I10 Essential (primary) hypertension: Secondary | ICD-10-CM | POA: Diagnosis not present

## 2021-01-04 DIAGNOSIS — F411 Generalized anxiety disorder: Secondary | ICD-10-CM | POA: Diagnosis not present

## 2021-01-16 DIAGNOSIS — E785 Hyperlipidemia, unspecified: Secondary | ICD-10-CM | POA: Diagnosis not present

## 2021-01-16 DIAGNOSIS — E119 Type 2 diabetes mellitus without complications: Secondary | ICD-10-CM | POA: Diagnosis not present

## 2021-01-17 LAB — HEPATIC FUNCTION PANEL
ALT: 66 U/L — AB (ref 10–40)
AST: 71 — AB (ref 14–40)
Bilirubin, Total: 0.4

## 2021-01-17 LAB — COMPREHENSIVE METABOLIC PANEL
Albumin: 3.8 (ref 3.5–5.0)
Calcium: 9.2 (ref 8.7–10.7)
eGFR: 113

## 2021-01-17 LAB — BASIC METABOLIC PANEL
BUN: 13 (ref 4–21)
CO2: 25 — AB (ref 13–22)
Chloride: 104 (ref 99–108)
Creatinine: 0.8 (ref 0.6–1.3)
Glucose: 137
Potassium: 5 mEq/L (ref 3.5–5.1)
Sodium: 140 (ref 137–147)

## 2021-01-17 LAB — LIPID PANEL
Cholesterol: 117 (ref 0–200)
HDL: 44 (ref 35–70)
LDL Cholesterol: 61
Triglycerides: 55 (ref 40–160)

## 2021-01-17 LAB — HEMOGLOBIN A1C: Hemoglobin A1C: 6

## 2021-06-06 NOTE — Progress Notes (Signed)
? ?New Patient Visit ? ?BP 134/88 (BP Location: Right Wrist, Cuff Size: Normal)   Pulse 76   Temp 97.9 ?F (36.6 ?C) (Temporal)   Ht 6' 3.5" (1.918 m)   Wt (!) 393 lb 6.4 oz (178.4 kg)   SpO2 98%   BMI 48.52 kg/m?   ? ?Subjective:  ? ? Patient ID: Peter Gutierrez, male    DOB: 01-12-1981, 41 y.o.   MRN: [id number] ? ?CC: ?Chief Complaint  ?Patient presents with  ? Establish Care  ?  Np Est care. Extreme wheezing when laying down  ? ? ?HPI: ?Peter Gutierrez is a 41 y.o. male who presents for new patient visit to establish care.  Introduced to Publishing rights manager role and practice setting.  All questions answered.  Discussed provider/patient relationship and expectations. ? ?He has a history of OSA and wears a CPAP at night.  He states that he had only been wearing it about 3 times a month, however recently he has been wearing it most days.  ? ?He has been having wheezing and coughing for about a month. Laying down makes it worse. His wife says that she can hear him wheezing across the room. He gets short of breath with activity. He quit smoking cigarettes about 20 years ago. He still does chew tobacco. He uses an albuterol a few times a week.  He denies chest pain. ? ?He drinks about 7 alcoholic drinks per day. Some days he drinks less, others more. He has been having more forgetfullness recently over the past year. He states that he has bought the same tool mulitiple times.  ? ?He has a history of high blood pressure. He usually checks his blood pressure if he is feeling bad or has a headache.  During this case, his blood pressure is normally 130s/90s.  He denies a headache, chest pain today. ? ?He has a history of anxiety and depression. He has tried lexapro, bupsar, fluoxetine. He does not fully remember the names of all the medication and one of them made him jittery and angry but he does not remember which one. He is currently taking wellbutrin 300mg  which is not fully helping his symptoms. He is still having  anxiety and depression. He does have trouble sleeping, however his work schedule switches between first and third shift. He has tried therapy a few times and didn't like it or helped him.  ? ? ?  06/07/2021  ? 10:28 AM 06/07/2021  ?  9:41 AM  ?Depression screen PHQ 2/9  ?Decreased Interest 2 3  ?Down, Depressed, Hopeless 2 3  ?PHQ - 2 Score 4 6  ?Altered sleeping 3   ?Tired, decreased energy 3   ?Change in appetite 2   ?Feeling bad or failure about yourself  2   ?Trouble concentrating 2   ?Moving slowly or fidgety/restless 0   ?Suicidal thoughts 0   ?PHQ-9 Score 16   ?Difficult doing work/chores Very difficult   ? ? ?  06/07/2021  ? 10:28 AM  ?GAD 7 : Generalized Anxiety Score  ?Nervous, Anxious, on Edge 3  ?Control/stop worrying 3  ?Worry too much - different things 3  ?Trouble relaxing 2  ?Restless 2  ?Easily annoyed or irritable 3  ?Afraid - awful might happen 2  ?Total GAD 7 Score 18  ?Anxiety Difficulty Very difficult  ? ? ?Past Medical History:  ?Diagnosis Date  ? Elevated liver function tests   ? Fatty liver   ? Hypertension   ?  Kidney stone   ? Morbid obesity (HCC)   ? PONV (postoperative nausea and vomiting)   ? Prediabetes   ? Sinus tachycardia   ? Sleep apnea   ? ? ?Past Surgical History:  ?Procedure Laterality Date  ? CHOLECYSTECTOMY    ? EXTRACORPOREAL SHOCK WAVE LITHOTRIPSY Right 07/27/2019  ? Procedure: EXTRACORPOREAL SHOCK WAVE LITHOTRIPSY (ESWL);  Surgeon: Heloise PurpuraBorden, Lester, MD;  [facility] [place];  Service: Urology;  Laterality: Right;  ? ? ?Family History  ?Problem Relation Age of Onset  ? Kidney disease Mother   ? Diabetes Mother   ? Liver disease Mother   ? Stroke Father   ? Diabetes Father   ? Arthritis Father   ? Heart disease Father   ? Hypertension Father   ? Hyperlipidemia Father   ? Heart attack Father   ? Colon cancer Maternal Grandfather   ? Heart disease Maternal Grandfather   ? Hypertension Maternal Grandfather   ? Hyperlipidemia Maternal Grandfather   ? Heart attack  Maternal Grandfather   ?  ? ?Current Outpatient Medications on File Prior to Visit  ?Medication Sig Dispense Refill  ? albuterol (VENTOLIN HFA) 108 (90 Base) MCG/ACT inhaler Inhale into the lungs every 6 (six) hours as needed for wheezing or shortness of breath.    ? OZEMPIC, 1 MG/DOSE, 4 MG/3ML SOPN SMARTSIG:1 pre-filled pen syringe SUB-Q Once a Week    ? traZODone (DESYREL) 50 MG tablet Take 50 mg by mouth at bedtime as needed for sleep.    ? ?No current facility-administered medications on file prior to visit.  ?  ? ?Review of Systems  ?Constitutional:  Positive for fatigue. Negative for fever.  ?HENT: Negative.    ?Eyes: Negative.   ?Respiratory:  Positive for cough, shortness of breath (with exertion) and wheezing.   ?Cardiovascular: Negative.   ?Gastrointestinal: Negative.   ?Endocrine: Positive for polyuria.  ?Genitourinary:  Positive for frequency. Negative for dysuria.  ?Musculoskeletal:  Positive for arthralgias (shoulders, stable).  ?Skin: Negative.   ?Neurological: Negative.   ?Psychiatric/Behavioral:  Positive for dysphoric mood. The patient is nervous/anxious.   ? ?   ?Objective:  ?  ?BP 134/88 (BP Location: Right Wrist, Cuff Size: Normal)   Pulse 76   Temp 97.9 ?F (36.6 ?C) (Temporal)   Ht 6' 3.5" (1.918 m)   Wt (!) 393 lb 6.4 oz (178.4 kg)   SpO2 98%   BMI 48.52 kg/m?   ?Wt Readings from Last 3 Encounters:  ?06/07/21 (!) 393 lb 6.4 oz (178.4 kg)  ?07/27/19 (!) 375 lb (170.1 kg)  ?10/03/18 (!) 371 lb 12.8 oz (168.6 kg)  ?  ?BP Readings from Last 3 Encounters:  ?06/07/21 134/88  ?07/27/19 130/88  ?10/03/18 (!) 134/96  ?  ?Physical Exam ?Vitals and nursing note reviewed.  ?Constitutional:   ?   General: He is not in acute distress. ?   Appearance: Normal appearance. He is obese.  ?HENT:  ?   Head: Normocephalic and atraumatic.  ?   Right Ear: Tympanic membrane, ear canal and external ear normal.  ?   Left Ear: Tympanic membrane, ear canal and external ear normal.  ?Eyes:  ?   Conjunctiva/sclera:  Conjunctivae normal.  ?Cardiovascular:  ?   Rate and Rhythm: Normal rate and regular rhythm.  ?   Pulses: Normal pulses.  ?   Heart sounds: Normal heart sounds.  ?Pulmonary:  ?   Effort: Pulmonary effort is normal.  ?   Breath sounds: Normal breath sounds.  ?  Abdominal:  ?   Palpations: Abdomen is soft.  ?   Tenderness: There is no abdominal tenderness.  ?Musculoskeletal:     ?   General: Normal range of motion.  ?   Cervical back: Normal range of motion and neck supple. No tenderness.  ?   Right lower leg: No edema.  ?   Left lower leg: No edema.  ?Lymphadenopathy:  ?   Cervical: No cervical adenopathy.  ?Skin: ?   General: Skin is warm and dry.  ?Neurological:  ?   General: No focal deficit present.  ?   Mental Status: He is alert and oriented to person, place, and time.  ?Psychiatric:     ?   Mood and Affect: Mood normal.     ?   Behavior: Behavior normal.     ?   Thought Content: Thought content normal.     ?   Judgment: Judgment normal.  ? ? ?CMP  ?   ?Component Value Date/Time  ? NA 138 06/07/2021 1028  ? NA 140 01/17/2021 0000  ? K 4.3 06/07/2021 1028  ? CL 106 06/07/2021 1028  ? CO2 23 06/07/2021 1028  ? GLUCOSE 101 (H) 06/07/2021 1028  ? BUN 14 06/07/2021 1028  ? BUN 13 01/17/2021 0000  ? CREATININE 0.76 06/07/2021 1028  ? CALCIUM 9.4 06/07/2021 1028  ? PROT 7.3 06/07/2021 1028  ? ALBUMIN 4.0 06/07/2021 1028  ? AST 83 (H) 06/07/2021 1028  ? ALT 84 (H) 06/07/2021 1028  ? ALKPHOS 44 06/07/2021 1028  ? BILITOT 0.4 06/07/2021 1028  ?  ?CBC ?   ?Component Value Date/Time  ? WBC 8.5 06/07/2021 1028  ? RBC 5.44 06/07/2021 1028  ? HGB 14.5 06/07/2021 1028  ? HCT 44.5 06/07/2021 1028  ? PLT 215.0 06/07/2021 1028  ? MCV 81.8 06/07/2021 1028  ? MCHC 32.7 06/07/2021 1028  ? RDW 16.2 (H) 06/07/2021 1028  ? LYMPHSABS 2.3 06/07/2021 1028  ? MONOABS 0.9 06/07/2021 1028  ? EOSABS 0.8 (H) 06/07/2021 1028  ? BASOSABS 0.1 06/07/2021 1028  ?  ?Lipid Panel  ?   ?Component Value Date/Time  ? CHOL 117 01/17/2021 0000  ? TRIG 55  01/17/2021 0000  ? HDL 44 01/17/2021 0000  ? LDLCALC 61 01/17/2021 0000  ?  ?Results for orders placed or performed in visit on 06/07/21  ?Basic metabolic panel  ?Result Value Ref Range  ? Glucose 137   ? BUN 13 4 - 21  ?

## 2021-06-07 ENCOUNTER — Encounter: Payer: Self-pay | Admitting: Nurse Practitioner

## 2021-06-07 ENCOUNTER — Ambulatory Visit (INDEPENDENT_AMBULATORY_CARE_PROVIDER_SITE_OTHER): Payer: 59 | Admitting: Nurse Practitioner

## 2021-06-07 VITALS — BP 134/88 | HR 76 | Temp 97.9°F | Ht 75.5 in | Wt 393.4 lb

## 2021-06-07 DIAGNOSIS — F321 Major depressive disorder, single episode, moderate: Secondary | ICD-10-CM | POA: Diagnosis not present

## 2021-06-07 DIAGNOSIS — F419 Anxiety disorder, unspecified: Secondary | ICD-10-CM | POA: Diagnosis not present

## 2021-06-07 DIAGNOSIS — R7303 Prediabetes: Secondary | ICD-10-CM | POA: Diagnosis not present

## 2021-06-07 DIAGNOSIS — Z6841 Body Mass Index (BMI) 40.0 and over, adult: Secondary | ICD-10-CM | POA: Diagnosis not present

## 2021-06-07 DIAGNOSIS — R7989 Other specified abnormal findings of blood chemistry: Secondary | ICD-10-CM

## 2021-06-07 DIAGNOSIS — I10 Essential (primary) hypertension: Secondary | ICD-10-CM | POA: Diagnosis not present

## 2021-06-07 DIAGNOSIS — G4733 Obstructive sleep apnea (adult) (pediatric): Secondary | ICD-10-CM

## 2021-06-07 DIAGNOSIS — R0609 Other forms of dyspnea: Secondary | ICD-10-CM

## 2021-06-07 LAB — COMPREHENSIVE METABOLIC PANEL
ALT: 84 U/L — ABNORMAL HIGH (ref 0–53)
AST: 83 U/L — ABNORMAL HIGH (ref 0–37)
Albumin: 4 g/dL (ref 3.5–5.2)
Alkaline Phosphatase: 44 U/L (ref 39–117)
BUN: 14 mg/dL (ref 6–23)
CO2: 23 mEq/L (ref 19–32)
Calcium: 9.4 mg/dL (ref 8.4–10.5)
Chloride: 106 mEq/L (ref 96–112)
Creatinine, Ser: 0.76 mg/dL (ref 0.40–1.50)
GFR: 111.58 mL/min (ref >60.00)
Glucose, Bld: 101 mg/dL — ABNORMAL HIGH (ref 70–99)
Potassium: 4.3 mEq/L (ref 3.5–5.1)
Sodium: 138 mEq/L (ref 135–145)
Total Bilirubin: 0.4 mg/dL (ref 0.2–1.2)
Total Protein: 7.3 g/dL (ref 6.0–8.3)

## 2021-06-07 LAB — HEMOGLOBIN A1C: Hgb A1c MFr Bld: 6 % (ref 4.6–6.5)

## 2021-06-07 LAB — CBC WITH DIFFERENTIAL/PLATELET
Basophils Absolute: 0.1 10*3/uL (ref 0.0–0.1)
Basophils Relative: 0.8 % (ref 0.0–3.0)
Eosinophils Absolute: 0.8 10*3/uL — ABNORMAL HIGH (ref 0.0–0.7)
Eosinophils Relative: 9.4 % — ABNORMAL HIGH (ref 0.0–5.0)
HCT: 44.5 % (ref 39.0–52.0)
Hemoglobin: 14.5 g/dL (ref 13.0–17.0)
Lymphocytes Relative: 27.5 % (ref 12.0–46.0)
Lymphs Abs: 2.3 10*3/uL (ref 0.7–4.0)
MCHC: 32.7 g/dL (ref 30.0–36.0)
MCV: 81.8 fl (ref 78.0–100.0)
Monocytes Absolute: 0.9 10*3/uL (ref 0.1–1.0)
Monocytes Relative: 10.1 % (ref 3.0–12.0)
Neutro Abs: 4.4 10*3/uL (ref 1.4–7.7)
Neutrophils Relative %: 52.2 % (ref 43.0–77.0)
Platelets: 215 10*3/uL (ref 150.0–400.0)
RBC: 5.44 Mil/uL (ref 4.22–5.81)
RDW: 16.2 % — ABNORMAL HIGH (ref 11.5–15.5)
WBC: 8.5 10*3/uL (ref 4.0–10.5)

## 2021-06-07 LAB — TSH: TSH: 1.71 u[IU]/mL (ref 0.35–5.50)

## 2021-06-07 MED ORDER — MELOXICAM 15 MG PO TABS: 15.0000 mg | ORAL_TABLET | Freq: Every day | ORAL | 1 refills | Status: DC | PRN

## 2021-06-07 MED ORDER — OMEPRAZOLE 20 MG PO CPDR: 20.0000 mg | DELAYED_RELEASE_CAPSULE | Freq: Every day | ORAL | 1 refills | Status: DC

## 2021-06-07 MED ORDER — METOPROLOL SUCCINATE ER 100 MG PO TB24: 100.0000 mg | ORAL_TABLET | Freq: Every day | ORAL | 1 refills | Status: DC

## 2021-06-07 MED ORDER — NORTRIPTYLINE HCL 10 MG PO CAPS: 10.0000 mg | ORAL_CAPSULE | Freq: Every day | ORAL | 2 refills | Status: DC

## 2021-06-07 MED ORDER — TELMISARTAN 80 MG PO TABS: 80.0000 mg | ORAL_TABLET | Freq: Every day | ORAL | 1 refills | Status: DC

## 2021-06-07 MED ORDER — BUPROPION HCL ER (XL) 300 MG PO TB24: 300.0000 mg | ORAL_TABLET | Freq: Every day | ORAL | 1 refills | Status: DC

## 2021-06-07 NOTE — Assessment & Plan Note (Signed)
He has been having some shortness of breath with exertion and wheezing.  He uses an albuterol as needed and uses this about twice a week.  He has not had pulmonary function testing in the past.  We will place referral to pulmonology.  Continue albuterol as needed.  He can take Mucinex over-the-counter twice a day to help with chest congestion.  No wheezing heard on exam today. ?

## 2021-06-07 NOTE — Assessment & Plan Note (Signed)
BMI 48.  Recommend weight loss.  He is currently taking semaglutide 1 mg weekly to help with sugars and weight loss.  We will continue this regimen. ?

## 2021-06-07 NOTE — Patient Instructions (Addendum)
It was great to see you! ? ?Start nortirptyline 1 tablet at bedtime for anxiety and depression.  ? ?We are checking your labs today and will let you know the results via mychart/phone.  ? ?I am placing a referral to pulmonology, they will call to schedule an appointment. Continue to use your albuterol inhaler as needed for wheezing. You can also start mucinex twice a day to help with chest congestion.  ? ?Let's follow-up in 4-6 weeks, sooner if you have concerns. ? ?If a referral was placed today, you will be contacted for an appointment. Please note that routine referrals can sometimes take up to 3-4 weeks to process. Please call our office if you haven't heard anything after this time frame. ? ?Take care, ? ?Rodman Pickle, NP ? ?

## 2021-06-07 NOTE — Assessment & Plan Note (Signed)
Chronic, ongoing.  Encouraged him to use his CPAP regularly at night as this can help with fatigue and prevent conditions down the road.  His forgetfulness could be due to this as well and not using CPAP regularly.  He declines referral to neurology today. ?

## 2021-06-07 NOTE — Assessment & Plan Note (Signed)
Chronic, not controlled.  He states that he had done therapy in the past, however he did not like it.  He has tried multiple medications including Lexapro, BuSpar, fluoxetine.  He also endorses trouble sleeping.  We will start nortriptyline 10 mg daily at bedtime in addition to continuing the Wellbutrin 300 mg daily.  He denies SI/HI.  His PHQ-9 is a 16 and his GAD-7 is an 71 today.  Follow-up in 4 to 6 weeks. ?

## 2021-06-07 NOTE — Assessment & Plan Note (Signed)
Chronic, stable.  Continue telmisartan 80 mg daily and metoprolol XL 100 mg daily.  Check CMP, CBC.  Refill sent to pharmacy.  Follow-up in 6 months. ?

## 2021-06-07 NOTE — Assessment & Plan Note (Signed)
Chronic, not controlled.  He states that he had done therapy in the past, however he did not like it.  He has tried multiple medications including Lexapro, BuSpar, fluoxetine.  He also endorses trouble sleeping.  We will start nortriptyline 10 mg daily at bedtime in addition to continuing the Wellbutrin 300 mg daily.  He denies SI/HI.  His PHQ-9 is a 16 and his GAD-7 is an 18 today.  Follow-up in 4 to 6 weeks. ?

## 2021-06-07 NOTE — Assessment & Plan Note (Signed)
He has a history of prediabetes.  He is currently taking semaglutide 1 mg weekly.  We will continue this regimen.  Check A1c today.  Also requesting records from past primary care provider. ?

## 2021-06-08 NOTE — Addendum Note (Signed)
Addended by: Vance Peper A on: 06/08/2021 08:19 AM ? ? Modules accepted: Orders ? ?

## 2021-06-29 IMAGING — DX DG ABDOMEN 1V
2 series · 2 of 2 positions shown · non-contrast
Comparison: 07/21/2019, CT from 07/18/2019

CLINICAL DATA: Preop evaluation for upcoming lithotripsy

EXAM:
ABDOMEN - 1 VIEW

[abdomen kub (1 of 2)]
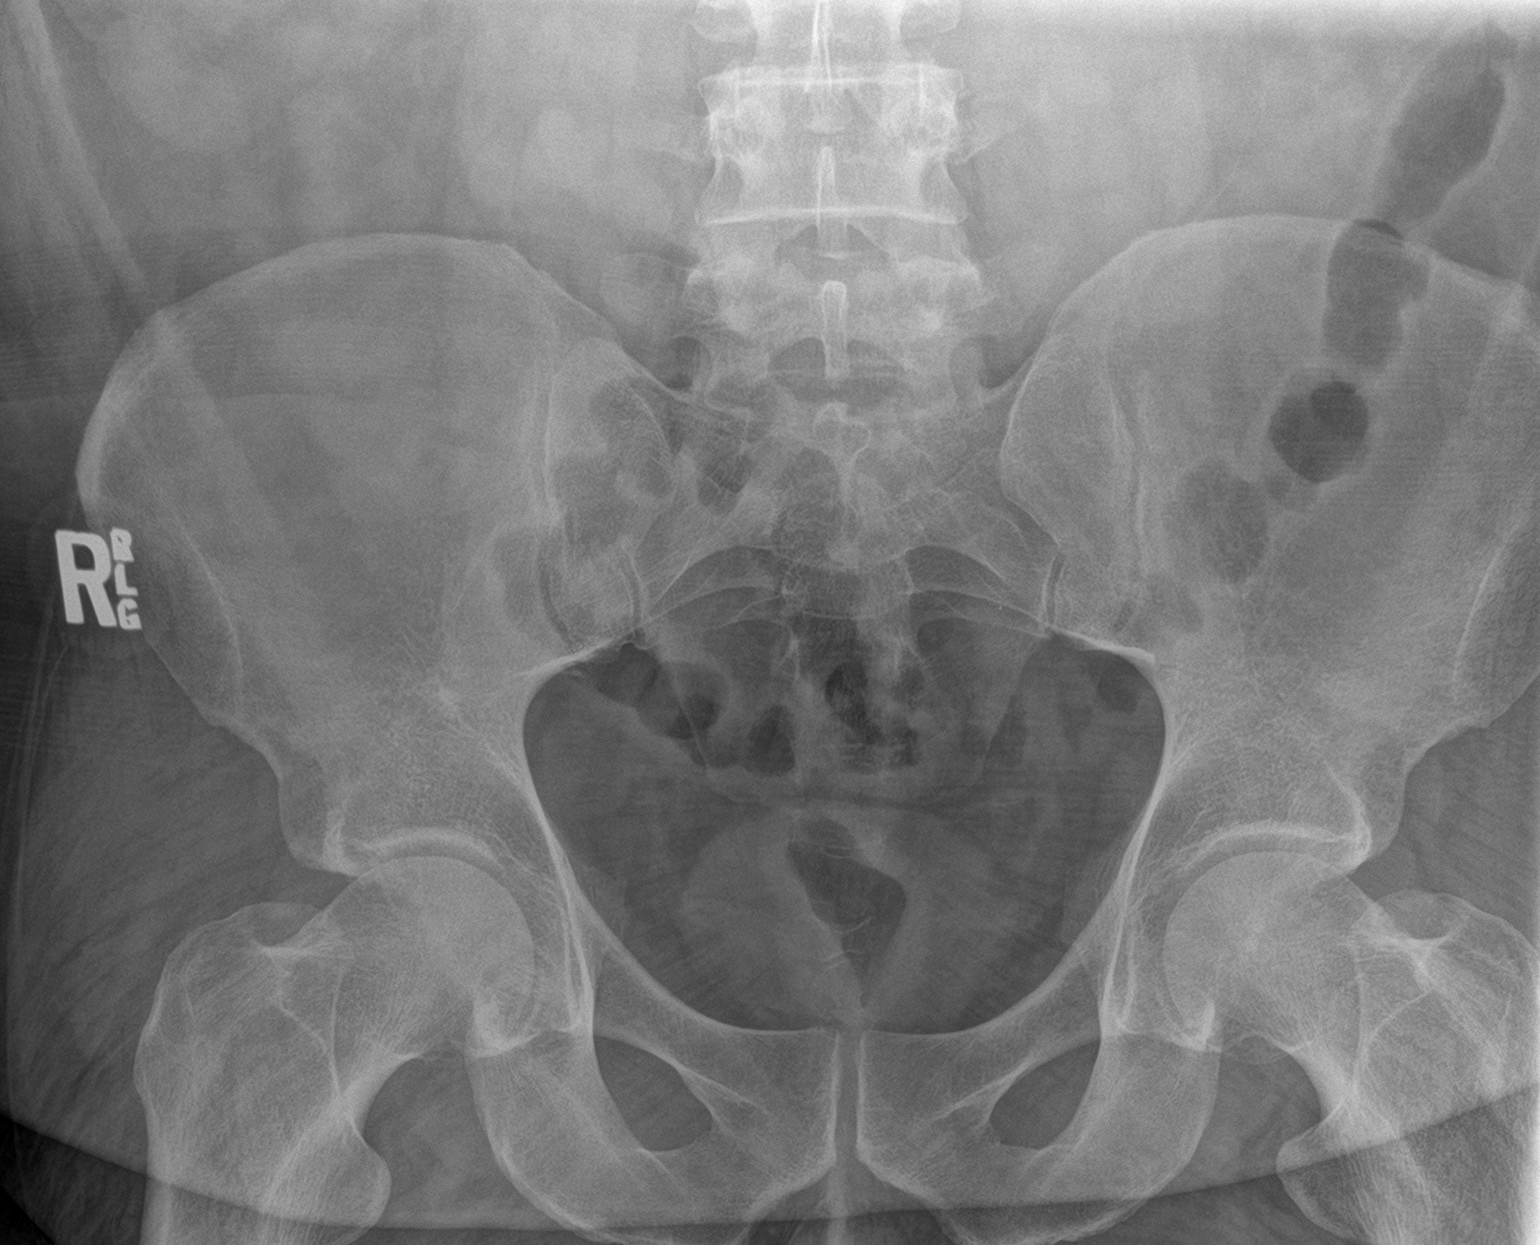

[abdomen kub (2 of 2)]
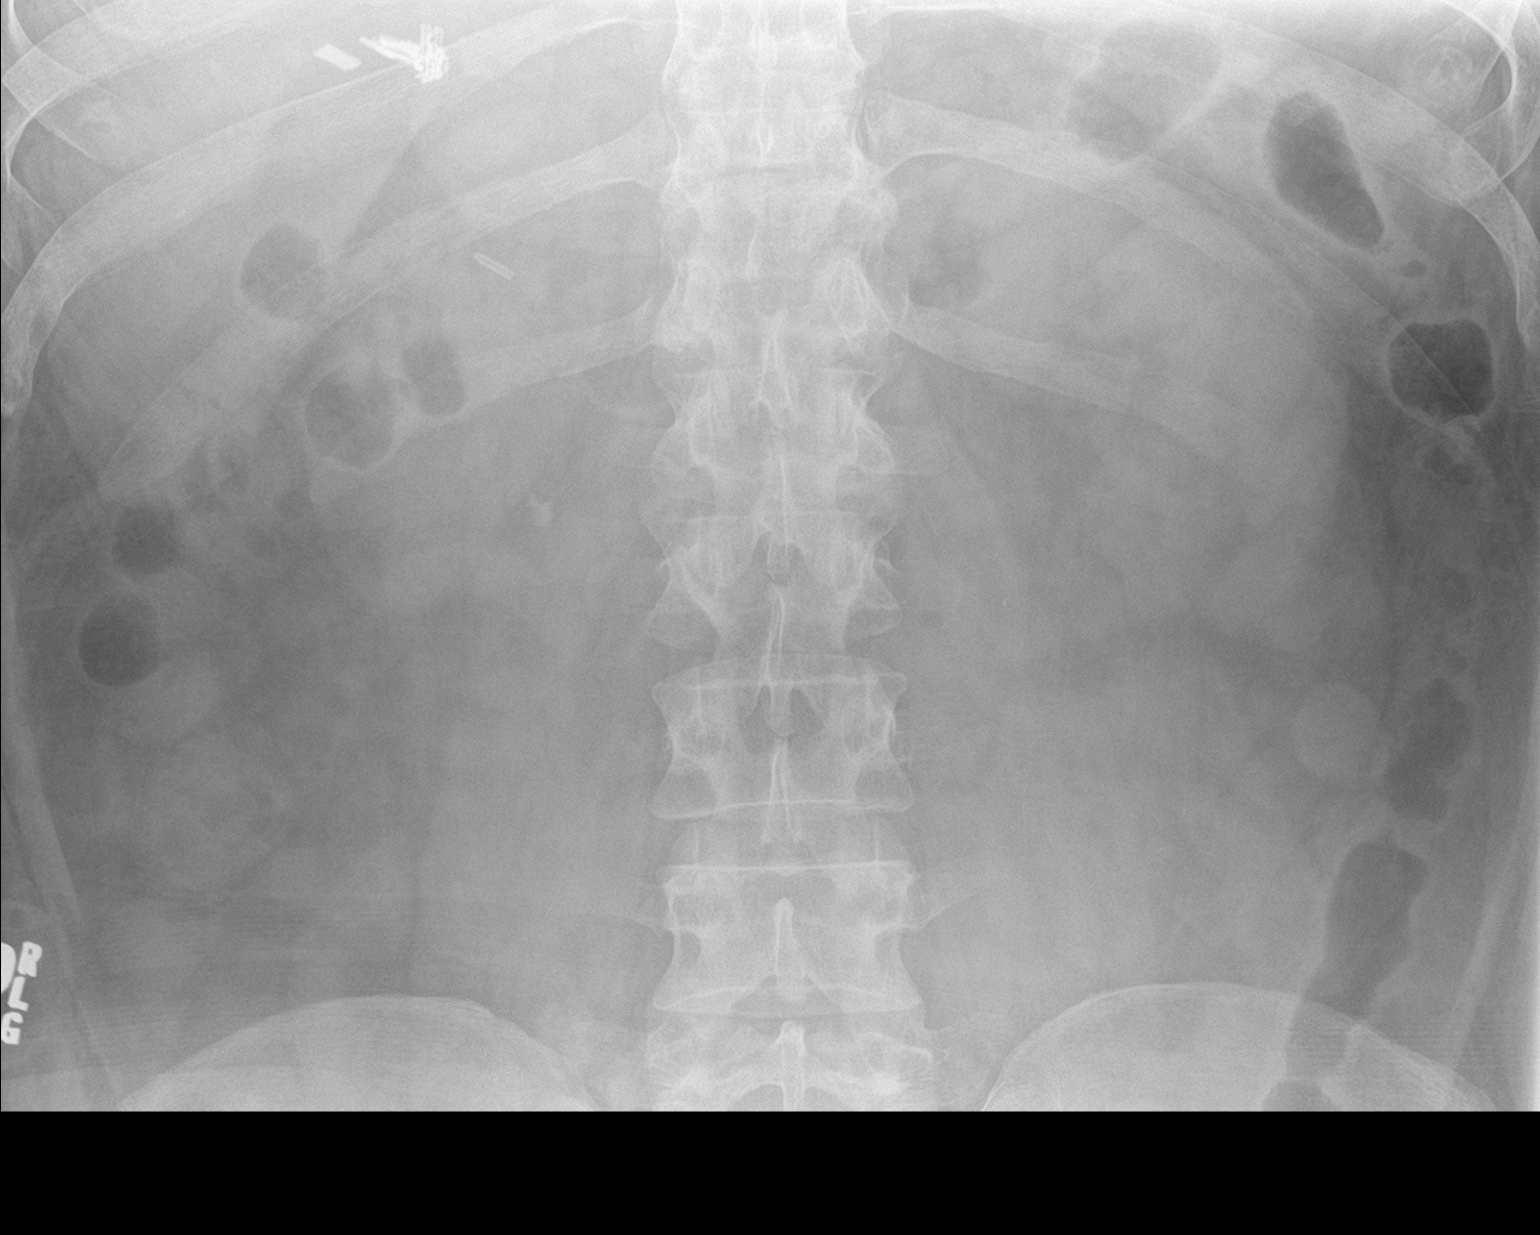

[2 of 2 positions shown; findings below may reference images not displayed]

FINDINGS: Scattered large and small bowel gas is noted. Stable proximal right
ureteral stone is again seen similar to that noted on prior plain
film and CT. No bony abnormality is noted.
IMPRESSION: Stable proximal right ureteral stone. Stable proximal right ureteral
stone.

## 2021-07-04 NOTE — Progress Notes (Unsigned)
   Established Patient Office Visit  Subjective  Patient ID: Peter Gutierrez, male    DOB: 1980/10/06  Age: 41 y.o. MRN: [id number]  No chief complaint on file.   HPI  Peter Gutierrez is here today to follow-up on anxiety and depression. Last visit he was started on notriptyline in addition to his wellbutrin xl 300mg  daily.    {History (Optional):23778}  ROS    Objective:     There were no vitals taken for this visit. {Vitals History (Optional):23777}  Physical Exam   No results found for any visits on 07/05/21.  {Labs (Optional):23779}  The ASCVD Risk score (Arnett DK, et al., 2019) failed to calculate for the following reasons:   The valid total cholesterol range is 130 to 320 mg/dL    Assessment & Plan:   Problem List Items Addressed This Visit   None   No follow-ups on file.    2020, NP

## 2021-07-05 ENCOUNTER — Encounter: Payer: Self-pay | Admitting: Nurse Practitioner

## 2021-07-05 ENCOUNTER — Ambulatory Visit (INDEPENDENT_AMBULATORY_CARE_PROVIDER_SITE_OTHER): Payer: 59 | Admitting: Nurse Practitioner

## 2021-07-05 VITALS — BP 130/90 | HR 69 | Temp 97.5°F | Wt >= 6400 oz

## 2021-07-05 DIAGNOSIS — F321 Major depressive disorder, single episode, moderate: Secondary | ICD-10-CM | POA: Diagnosis not present

## 2021-07-05 DIAGNOSIS — F419 Anxiety disorder, unspecified: Secondary | ICD-10-CM

## 2021-07-05 DIAGNOSIS — G4733 Obstructive sleep apnea (adult) (pediatric): Secondary | ICD-10-CM

## 2021-07-05 DIAGNOSIS — N529 Male erectile dysfunction, unspecified: Secondary | ICD-10-CM

## 2021-07-05 DIAGNOSIS — B356 Tinea cruris: Secondary | ICD-10-CM

## 2021-07-05 MED ORDER — NORTRIPTYLINE HCL 25 MG PO CAPS: 25.0000 mg | ORAL_CAPSULE | Freq: Every day | ORAL | 1 refills | Status: DC

## 2021-07-05 MED ORDER — CLOTRIMAZOLE 1 % EX OINT: 1.0000 "application " | TOPICAL_OINTMENT | Freq: Every day | CUTANEOUS | 2 refills | Status: AC

## 2021-07-05 MED ORDER — TADALAFIL 20 MG PO TABS: 10.0000 mg | ORAL_TABLET | ORAL | 11 refills | Status: DC | PRN

## 2021-07-05 NOTE — Assessment & Plan Note (Addendum)
Chronic, ongoing.  He states that his anxiety and depression have not improved significantly since starting the nortriptyline.  Will increase his nortriptyline to 25 mg at bedtime.  His PHQ-9 has improved from a 16 to a 13 and his GAD-7 has improved from a 18 to an 8.  Follow-up in 6 weeks.

## 2021-07-05 NOTE — Assessment & Plan Note (Signed)
He has been using his CPAP at night and has only missed 2 nights in the past month.  Congratulated him on this and encouraged him to continue using his CPAP nightly.

## 2021-07-05 NOTE — Assessment & Plan Note (Addendum)
He has been having issues with erectile dysfunction function and maintaining an erection.  We will start Cialis 10 to 20 mg as needed.  Discussed possible side effects.  Follow-up if symptoms worsen or do not improve.

## 2021-07-05 NOTE — Assessment & Plan Note (Signed)
Tinea cruris to his perineal area.  We will start him on clotrimazole cream daily.  He can continue using the Lamisil spray as well.  Encouraged him to keep the area clean and dry especially if he starts sweating.

## 2021-07-05 NOTE — Patient Instructions (Addendum)
It was great to see you!  Increase your nortriptyline to 25mg  at bedtime.   Start clotrimazole cream daily to perineal area.   Start cialis as needed for erection, do not take this more than every 48 hours.    Let's follow-up in 6 weeks, sooner if you have concerns.  If a referral was placed today, you will be contacted for an appointment. Please note that routine referrals can sometimes take up to 3-4 weeks to process. Please call our office if you haven't heard anything after this time frame.  Take care,  Vance Peper, NP

## 2021-07-05 NOTE — Assessment & Plan Note (Addendum)
Chronic, ongoing.  He states that his anxiety and depression have not improved significantly since starting the nortriptyline.  Will increase his nortriptyline to 25 mg at bedtime.  His PHQ-9 has improved from a 16 to a 13 and his GAD-7 has improved from a 18 to an 8.  Follow-up in 6 weeks. 

## 2021-08-10 ENCOUNTER — Other Ambulatory Visit: Payer: Self-pay | Admitting: Nurse Practitioner

## 2021-08-15 NOTE — Progress Notes (Unsigned)
   Established Patient Office Visit  Subjective   Patient ID: Peter Gutierrez, male    DOB: Apr 18, 1980  Age: 41 y.o. MRN: [id number]  No chief complaint on file.   HPI  Peter Gutierrez is here to follow-up on depression and anxiety. Last visit his nortriptyline was increased to 25mg  daily.      07/05/2021    9:58 AM 06/07/2021   10:28 AM 06/07/2021    9:41 AM  Depression screen PHQ 2/9  Decreased Interest 3 2 3   Down, Depressed, Hopeless 1 2 3   PHQ - 2 Score 4 4 6   Altered sleeping 1 3   Tired, decreased energy 3 3   Change in appetite 3 2   Feeling bad or failure about yourself  1 2   Trouble concentrating 0 2   Moving slowly or fidgety/restless 1 0   Suicidal thoughts 0 0   PHQ-9 Score 13 16   Difficult doing work/chores Somewhat difficult Very difficult       07/05/2021    9:58 AM 06/07/2021   10:28 AM  GAD 7 : Generalized Anxiety Score  Nervous, Anxious, on Edge 1 3  Control/stop worrying 1 3  Worry too much - different things 2 3  Trouble relaxing 0 2  Restless 1 2  Easily annoyed or irritable 3 3  Afraid - awful might happen 0 2  Total GAD 7 Score 8 18  Anxiety Difficulty Somewhat difficult Very difficult    {History (Optional):23778}  ROS    Objective:     There were no vitals taken for this visit. {Vitals History (Optional):23777}  Physical Exam   No results found for any visits on 08/16/21.  {Labs (Optional):23779}  The ASCVD Risk score (Arnett DK, et al., 2019) failed to calculate for the following reasons:   The valid total cholesterol range is 130 to 320 mg/dL    Assessment & Plan:   Problem List Items Addressed This Visit   None   No follow-ups on file.    09/04/2021, NP

## 2021-08-16 ENCOUNTER — Encounter: Payer: Self-pay | Admitting: Nurse Practitioner

## 2021-08-16 ENCOUNTER — Ambulatory Visit (INDEPENDENT_AMBULATORY_CARE_PROVIDER_SITE_OTHER): Payer: 59 | Admitting: Nurse Practitioner

## 2021-08-16 VITALS — BP 130/90 | HR 80 | Temp 97.5°F | Wt >= 6400 oz

## 2021-08-16 DIAGNOSIS — F419 Anxiety disorder, unspecified: Secondary | ICD-10-CM

## 2021-08-16 DIAGNOSIS — F321 Major depressive disorder, single episode, moderate: Secondary | ICD-10-CM | POA: Diagnosis not present

## 2021-08-16 DIAGNOSIS — R0989 Other specified symptoms and signs involving the circulatory and respiratory systems: Secondary | ICD-10-CM | POA: Diagnosis not present

## 2021-08-16 MED ORDER — DULOXETINE HCL 30 MG PO CPEP: ORAL_CAPSULE | ORAL | 1 refills | Status: DC

## 2021-08-16 NOTE — Assessment & Plan Note (Signed)
Chronic, not controlled.  His dad died recently which has exacerbated his symptoms.  The nortriptyline is not helping at all.  We will have him stop this medication and start Cymbalta 30 mg daily.  Discussed possible side effects.  Follow-up in 6 to 8 weeks.

## 2021-08-16 NOTE — Assessment & Plan Note (Signed)
Chronic, not controlled.  He states that the nortriptyline has not really made a difference on his mood.  He is taking the Wellbutrin 300 mg daily as well.  We will have him continue this, and start Cymbalta 30 mg daily.  He declines referral for therapy and to psychiatry.  Follow-up in 6 to 8 weeks.

## 2021-08-16 NOTE — Patient Instructions (Addendum)
It was great to see you!  Stop the nortriptyline. Keep taking the wellbutrin. Start cymbalta 1 capsule daily for 4 weeks. May increase to 2 capsules daily after 4 weeks if needed.   You can start mucinex as needed for congestion. Make sure you are drinking plenty of fluids.   Let's follow-up in 8 weeks, sooner if you have concerns.  If a referral was placed today, you will be contacted for an appointment. Please note that routine referrals can sometimes take up to 3-4 weeks to process. Please call our office if you haven't heard anything after this time frame.  Take care,  Rodman Pickle, NP

## 2021-08-30 ENCOUNTER — Other Ambulatory Visit: Payer: Self-pay | Admitting: Nurse Practitioner

## 2021-09-24 ENCOUNTER — Encounter: Payer: Self-pay | Admitting: Nurse Practitioner

## 2021-09-26 ENCOUNTER — Ambulatory Visit (HOSPITAL_BASED_OUTPATIENT_CLINIC_OR_DEPARTMENT_OTHER)
Admission: RE | Admit: 2021-09-26 | Discharge: 2021-09-26 | Disposition: A | Discharge status: Home / Self Care | Payer: 59 | Source: Ambulatory Visit | Attending: Nurse Practitioner | Admitting: Nurse Practitioner

## 2021-09-26 DIAGNOSIS — K760 Fatty (change of) liver, not elsewhere classified: Secondary | ICD-10-CM | POA: Insufficient documentation

## 2021-09-26 DIAGNOSIS — R7989 Other specified abnormal findings of blood chemistry: Secondary | ICD-10-CM | POA: Insufficient documentation

## 2021-09-28 ENCOUNTER — Other Ambulatory Visit: Payer: Self-pay | Admitting: Nurse Practitioner

## 2021-10-07 ENCOUNTER — Other Ambulatory Visit: Payer: Self-pay | Admitting: Nurse Practitioner

## 2021-10-10 NOTE — Telephone Encounter (Signed)
Chart supports rx refill Last ov: 08/16/21 Last refill: n/a

## 2021-10-17 NOTE — Progress Notes (Unsigned)
   Established Patient Office Visit  Subjective   Patient ID: Peter Gutierrez, male    DOB: 08-Feb-1980  Age: 41 y.o. MRN: [id number]  No chief complaint on file.   HPI  Peter Gutierrez is here to follow-up on depression and anxiety. Last visit he was started on duloxetine 30mg  daily.   {History (Optional):23778}  ROS    Objective:     There were no vitals taken for this visit. {Vitals History (Optional):23777}  Physical Exam   No results found for any visits on 10/18/21.  {Labs (Optional):23779}  The ASCVD Risk score (Arnett DK, et al., 2019) failed to calculate for the following reasons:   The valid total cholesterol range is 130 to 320 mg/dL    Assessment & Plan:   Problem List Items Addressed This Visit   None   No follow-ups on file.    Charyl Dancer, NP

## 2021-10-18 ENCOUNTER — Ambulatory Visit (INDEPENDENT_AMBULATORY_CARE_PROVIDER_SITE_OTHER): Payer: 59 | Admitting: Nurse Practitioner

## 2021-10-18 ENCOUNTER — Encounter: Payer: Self-pay | Admitting: Nurse Practitioner

## 2021-10-18 VITALS — BP 119/82 | HR 76 | Temp 97.3°F | Wt 385.0 lb

## 2021-10-18 DIAGNOSIS — F419 Anxiety disorder, unspecified: Secondary | ICD-10-CM

## 2021-10-18 DIAGNOSIS — F321 Major depressive disorder, single episode, moderate: Secondary | ICD-10-CM

## 2021-10-18 DIAGNOSIS — R69 Illness, unspecified: Secondary | ICD-10-CM | POA: Diagnosis not present

## 2021-10-18 MED ORDER — TRAZODONE HCL 100 MG PO TABS: 100.0000 mg | ORAL_TABLET | Freq: Every day | ORAL | 1 refills | Status: DC

## 2021-10-18 MED ORDER — CLONIDINE HCL 0.1 MG PO TABS: 0.1000 mg | ORAL_TABLET | Freq: Every day | ORAL | 1 refills | Status: AC

## 2021-10-18 MED ORDER — DULOXETINE HCL 60 MG PO CPEP: 60.0000 mg | ORAL_CAPSULE | Freq: Every day | ORAL | 1 refills | Status: DC

## 2021-10-18 NOTE — Patient Instructions (Signed)
It was great to see you!  Start clonidine 0.1mg  at bedtime.   I have refilled your duloxetine and the trazodone at the higher dose, so just take 1 pill daily.   Let's follow-up in 2-3 months, sooner if you have concerns.  If a referral was placed today, you will be contacted for an appointment. Please note that routine referrals can sometimes take up to 3-4 weeks to process. Please call our office if you haven't heard anything after this time frame.  Take care,  Vance Peper, NP

## 2021-10-18 NOTE — Assessment & Plan Note (Addendum)
Chronic, improving.  We will have him continue bupropion 300 mg daily, duloxetine 60 mg daily.  His PHQ-9 improved from a 13 to a 5.  Follow-up in 2 to 3 months.

## 2021-10-18 NOTE — Assessment & Plan Note (Signed)
Chronic, stable.  He states that his anxiety has improved a little bit, however he is still having some anxiety.  His GAD-7 is stable at a 12.  We will have him continue duloxetine 60 mg daily, Wellbutrin 300 mg daily.  He is having some trouble sleeping at night if he does not take the trazodone, refill sent to the pharmacy.  He also states that he took clonidine accidentally 1 night and he slept very well.  We will have him start clonidine 0.1 mg at bedtime.  Follow-up in 2 to 3 months.

## 2021-11-25 ENCOUNTER — Other Ambulatory Visit: Payer: Self-pay | Admitting: Nurse Practitioner

## 2021-12-07 DIAGNOSIS — Z6841 Body Mass Index (BMI) 40.0 and over, adult: Secondary | ICD-10-CM | POA: Diagnosis not present

## 2021-12-07 DIAGNOSIS — M1990 Unspecified osteoarthritis, unspecified site: Secondary | ICD-10-CM | POA: Diagnosis not present

## 2021-12-07 DIAGNOSIS — G4730 Sleep apnea, unspecified: Secondary | ICD-10-CM | POA: Diagnosis not present

## 2021-12-07 DIAGNOSIS — J45909 Unspecified asthma, uncomplicated: Secondary | ICD-10-CM | POA: Diagnosis not present

## 2021-12-07 DIAGNOSIS — R7303 Prediabetes: Secondary | ICD-10-CM | POA: Diagnosis not present

## 2021-12-07 DIAGNOSIS — N529 Male erectile dysfunction, unspecified: Secondary | ICD-10-CM | POA: Diagnosis not present

## 2021-12-07 DIAGNOSIS — I10 Essential (primary) hypertension: Secondary | ICD-10-CM | POA: Diagnosis not present

## 2021-12-07 DIAGNOSIS — I739 Peripheral vascular disease, unspecified: Secondary | ICD-10-CM | POA: Diagnosis not present

## 2021-12-07 DIAGNOSIS — R69 Illness, unspecified: Secondary | ICD-10-CM | POA: Diagnosis not present

## 2021-12-07 DIAGNOSIS — K219 Gastro-esophageal reflux disease without esophagitis: Secondary | ICD-10-CM | POA: Diagnosis not present

## 2021-12-23 ENCOUNTER — Other Ambulatory Visit: Payer: Self-pay | Admitting: Nurse Practitioner

## 2021-12-25 NOTE — Telephone Encounter (Signed)
..  Chart supports rx  Last ov:10/18/21  Next ov:01/17/22

## 2022-01-05 ENCOUNTER — Telehealth: Payer: Self-pay

## 2022-01-05 ENCOUNTER — Other Ambulatory Visit (HOSPITAL_COMMUNITY): Payer: Self-pay

## 2022-01-05 NOTE — Telephone Encounter (Signed)
Pharmacy Patient Advocate Encounter   Received notification from [facility] that prior authorization for Telmisartan 80mg  is required/requested.    PA submitted on 01/05/22 to [facility] [place] Medicaid via CoverMyMeds  [facility]  Status is pending

## 2022-01-08 NOTE — Telephone Encounter (Signed)
Pharmacy Patient Advocate Encounter  Received notification from [facility] [place] Medicaid that the request for prior authorization for Telmisartan 80mg  has been denied due to No previous trial/failure of medications on formulary, options on formulary include: Irbesartan, Losartan, Olmesartan, Valsartan

## 2022-01-10 DIAGNOSIS — J020 Streptococcal pharyngitis: Secondary | ICD-10-CM | POA: Diagnosis not present

## 2022-01-10 DIAGNOSIS — R051 Acute cough: Secondary | ICD-10-CM | POA: Diagnosis not present

## 2022-01-12 ENCOUNTER — Encounter: Payer: Self-pay | Admitting: Nurse Practitioner

## 2022-01-17 ENCOUNTER — Encounter: Payer: Self-pay | Admitting: Nurse Practitioner

## 2022-01-17 ENCOUNTER — Ambulatory Visit (INDEPENDENT_AMBULATORY_CARE_PROVIDER_SITE_OTHER): Payer: Medicaid Other | Admitting: Nurse Practitioner

## 2022-01-17 VITALS — BP 126/86 | HR 94 | Temp 97.2°F | Ht 72.0 in | Wt 378.0 lb

## 2022-01-17 DIAGNOSIS — I10 Essential (primary) hypertension: Secondary | ICD-10-CM | POA: Diagnosis not present

## 2022-01-17 DIAGNOSIS — G4733 Obstructive sleep apnea (adult) (pediatric): Secondary | ICD-10-CM | POA: Diagnosis not present

## 2022-01-17 DIAGNOSIS — F321 Major depressive disorder, single episode, moderate: Secondary | ICD-10-CM

## 2022-01-17 DIAGNOSIS — L989 Disorder of the skin and subcutaneous tissue, unspecified: Secondary | ICD-10-CM

## 2022-01-17 DIAGNOSIS — R7303 Prediabetes: Secondary | ICD-10-CM | POA: Diagnosis not present

## 2022-01-17 DIAGNOSIS — Z0000 Encounter for general adult medical examination without abnormal findings: Secondary | ICD-10-CM

## 2022-01-17 DIAGNOSIS — R39198 Other difficulties with micturition: Secondary | ICD-10-CM

## 2022-01-17 LAB — COMPREHENSIVE METABOLIC PANEL
ALT: 53 U/L (ref 0–53)
AST: 50 U/L — ABNORMAL HIGH (ref 0–37)
Albumin: 3.8 g/dL (ref 3.5–5.2)
Alkaline Phosphatase: 46 U/L (ref 39–117)
BUN: 12 mg/dL (ref 6–23)
CO2: 30 mEq/L (ref 19–32)
Calcium: 9.5 mg/dL (ref 8.4–10.5)
Chloride: 106 mEq/L (ref 96–112)
Creatinine, Ser: 0.9 mg/dL (ref 0.40–1.50)
GFR: 105.57 mL/min (ref >60.00)
Glucose, Bld: 98 mg/dL (ref 70–99)
Potassium: 4.7 mEq/L (ref 3.5–5.1)
Sodium: 143 mEq/L (ref 135–145)
Total Bilirubin: 0.4 mg/dL (ref 0.2–1.2)
Total Protein: 6.6 g/dL (ref 6.0–8.3)

## 2022-01-17 LAB — CBC
HCT: 42.5 % (ref 39.0–52.0)
Hemoglobin: 14 g/dL (ref 13.0–17.0)
MCHC: 33 g/dL (ref 30.0–36.0)
MCV: 82.1 fl (ref 78.0–100.0)
Platelets: 270 10*3/uL (ref 150.0–400.0)
RBC: 5.17 Mil/uL (ref 4.22–5.81)
RDW: 14.5 % (ref 11.5–15.5)
WBC: 8.2 10*3/uL (ref 4.0–10.5)

## 2022-01-17 LAB — LIPID PANEL
Cholesterol: 104 mg/dL (ref 0–200)
HDL: 34.8 mg/dL — ABNORMAL LOW (ref >39.00)
LDL Cholesterol: 43 mg/dL (ref 0–99)
NonHDL: 68.97
Total CHOL/HDL Ratio: 3
Triglycerides: 128 mg/dL (ref 0.0–149.0)
VLDL: 25.6 mg/dL (ref 0.0–40.0)

## 2022-01-17 LAB — HEMOGLOBIN A1C: Hgb A1c MFr Bld: 6 % (ref 4.6–6.5)

## 2022-01-17 LAB — PSA: PSA: 1.65 ng/mL (ref 0.10–4.00)

## 2022-01-17 MED ORDER — VALSARTAN 160 MG PO TABS: 160.0000 mg | ORAL_TABLET | Freq: Every day | ORAL | 0 refills | Status: DC

## 2022-01-17 MED ORDER — BUPROPION HCL ER (XL) 150 MG PO TB24: 150.0000 mg | ORAL_TABLET | Freq: Every day | ORAL | 1 refills | Status: DC

## 2022-01-17 MED ORDER — TAMSULOSIN HCL 0.4 MG PO CAPS: 0.4000 mg | ORAL_CAPSULE | Freq: Every day | ORAL | 2 refills | Status: DC

## 2022-01-17 NOTE — Progress Notes (Signed)
BP 126/86 (BP Location: Right Arm, Patient Position: Sitting, Cuff Size: Large)   Pulse 94   Temp (!) 97.2 F (36.2 C) (Temporal)   Ht 6' (1.829 m)   Wt (!) 378 lb (171.5 kg)   SpO2 95%   BMI 51.27 kg/m    Subjective:    Patient ID: Peter Gutierrez, male    DOB: 1980-07-27, 41 y.o.   MRN: [id number]  CC: Chief Complaint  Patient presents with   Annual Exam    CPE, no concerns. Patient fasting.    HPI: Peter Gutierrez is a 41 y.o. male presenting on 01/17/2022 for comprehensive medical examination. Current medical complaints include: difficulty starting a urinary stream  This has been going on for a few months and some days are worse than others. It also tends to get worse after intercourse. He would like to have his prostate checked today.   He currently lives with: wife, kids Interim Problems from his last visit: no  Depression Screen done today and results listed below:     01/17/2022    8:34 AM 01/17/2022    8:12 AM 10/18/2021    8:54 AM 08/16/2021   10:33 AM 07/05/2021    9:58 AM  Depression screen PHQ 2/9  Decreased Interest 1 0 1 2 3   Down, Depressed, Hopeless 1 1 1 2 1   PHQ - 2 Score 2 1 2 4 4   Altered sleeping 1  2 2 1   Tired, decreased energy 1  0 2 3  Change in appetite 1  1 3 3   Feeling bad or failure about yourself  1  0 1 1  Trouble concentrating 0  0 1 0  Moving slowly or fidgety/restless 1  0 0 1  Suicidal thoughts 0  0 0 0  PHQ-9 Score 7  5 13 13   Difficult doing work/chores Somewhat difficult Not difficult at all  Very difficult Somewhat difficult    The patient does not have a history of falls. I did not complete a risk assessment for falls. A plan of care for falls was not documented.   Past Medical History:  Past Medical History:  Diagnosis Date   Elevated liver function tests    Fatty liver    Hypertension    Kidney stone    Morbid obesity (HCC)    PONV (postoperative nausea and vomiting)    Prediabetes    Sinus tachycardia    Sleep  apnea     Surgical History:  Past Surgical History:  Procedure Laterality Date   CHOLECYSTECTOMY     EXTRACORPOREAL SHOCK WAVE LITHOTRIPSY Right 07/27/2019   Procedure: EXTRACORPOREAL SHOCK WAVE LITHOTRIPSY (ESWL);  Surgeon: Raynelle Bring, MD;  Location: [facility];  Service: Urology;  Laterality: Right;    Medications:  Current Outpatient Medications on File Prior to Visit  Medication Sig   albuterol (VENTOLIN HFA) 108 (90 Base) MCG/ACT inhaler Inhale into the lungs every 6 (six) hours as needed for wheezing or shortness of breath.   amoxicillin (AMOXIL) 875 MG tablet Take 875 mg by mouth 2 (two) times daily.   aspirin 325 MG tablet Take 325 mg by mouth daily.   buPROPion (WELLBUTRIN XL) 300 MG 24 hr tablet Take 1 tablet (300 mg total) by mouth daily.   cloNIDine (CATAPRES) 0.1 MG tablet Take 1 tablet (0.1 mg total) by mouth at bedtime.   Clotrimazole 1 % OINT Apply 1 application. topically daily.   DULoxetine (CYMBALTA) 60 MG capsule Take  1 capsule (60 mg total) by mouth daily.   meloxicam (MOBIC) 15 MG tablet Take 1 tablet (15 mg total) by mouth daily as needed.   metoprolol succinate (TOPROL-XL) 100 MG 24 hr tablet Take 1 tablet (100 mg total) by mouth daily. Take with or immediately following a meal.   Multiple Vitamin (MULTIVITAMIN ADULT PO) Take by mouth.   Omega-3 Fatty Acids (FISH OIL OMEGA-3 PO) Take by mouth.   omeprazole (PRILOSEC) 20 MG capsule Take 1 capsule (20 mg total) by mouth daily.   tadalafil (CIALIS) 20 MG tablet Take 0.5-1 tablets (10-20 mg total) by mouth every other day as needed for erectile dysfunction.   traZODone (DESYREL) 100 MG tablet Take 1 tablet (100 mg total) by mouth at bedtime.   No current facility-administered medications on file prior to visit.    Allergies:  No Known Allergies  Social History:  Social History   Socioeconomic History   Marital status: Married    Spouse name: Not on file   Number of children: Not on file    Years of education: Not on file   Highest education level: Not on file  Occupational History   Not on file  Tobacco Use   Smoking status: Former    Packs/day: 0.50    Years: 7.00    Total pack years: 3.50    Types: Cigarettes    Quit date: 2011    Years since quitting: 12.9   Smokeless tobacco: Current    Types: Chew  Vaping Use   Vaping Use: Never used  Substance and Sexual Activity   Alcohol use: Yes    Alcohol/week: 49.0 standard drinks of alcohol    Types: 49 Shots of liquor per week   Drug use: Not Currently   Sexual activity: Yes  Other Topics Concern   Not on file  Social History Narrative   Not on file   Social Determinants of Health   Financial Resource Strain: Not on file  Food Insecurity: Not on file  Transportation Needs: Not on file  Physical Activity: Not on file  Stress: Not on file  Social Connections: Not on file  Intimate Partner Violence: Not on file   Social History   Tobacco Use  Smoking Status Former   Packs/day: 0.50   Years: 7.00   Total pack years: 3.50   Types: Cigarettes   Quit date: 2011   Years since quitting: 12.9  Smokeless Tobacco Current   Types: Chew   Social History   Substance and Sexual Activity  Alcohol Use Yes   Alcohol/week: 49.0 standard drinks of alcohol   Types: 40 Shots of liquor per week    Family History:  Family History  Problem Relation Age of Onset   Kidney disease Mother    Diabetes Mother    Liver disease Mother    Stroke Father    Diabetes Father    Arthritis Father    Heart disease Father    Hypertension Father    Hyperlipidemia Father    Heart attack Father    GI Bleed Father    Colon cancer Maternal Grandfather    Heart disease Maternal Grandfather    Hypertension Maternal Grandfather    Hyperlipidemia Maternal Grandfather    Heart attack Maternal Grandfather     Past medical history, surgical history, medications, allergies, family history and social history reviewed with patient  today and changes made to appropriate areas of the chart.   Review of Systems  Constitutional:  Positive  for malaise/fatigue. Negative for diaphoresis.  HENT: Negative.    Eyes: Negative.   Respiratory: Negative.    Cardiovascular: Negative.   Gastrointestinal: Negative.   Genitourinary:  Positive for frequency. Negative for dysuria, hematuria and urgency.       Difficulty starting urinary stream  Musculoskeletal: Negative.   Skin:        Mole that is getting larger on his face  Neurological: Negative.   Psychiatric/Behavioral:         Irritability   All other ROS negative except what is listed above and in the HPI.      Objective:    BP 126/86 (BP Location: Right Arm, Patient Position: Sitting, Cuff Size: Large)   Pulse 94   Temp (!) 97.2 F (36.2 C) (Temporal)   Ht 6' (1.829 m)   Wt (!) 378 lb (171.5 kg)   SpO2 95%   BMI 51.27 kg/m   Wt Readings from Last 3 Encounters:  01/17/22 (!) 378 lb (171.5 kg)  10/18/21 (!) 385 lb (174.6 kg)  08/16/21 (!) 403 lb 9.6 oz (183.1 kg)    Physical Exam Vitals and nursing note reviewed. Exam conducted with a chaperone present.  Constitutional:      General: He is not in acute distress.    Appearance: Normal appearance.  HENT:     Head: Normocephalic and atraumatic.     Right Ear: Tympanic membrane, ear canal and external ear normal.     Left Ear: Tympanic membrane, ear canal and external ear normal.  Eyes:     Conjunctiva/sclera: Conjunctivae normal.  Cardiovascular:     Rate and Rhythm: Normal rate and regular rhythm.     Pulses: Normal pulses.     Heart sounds: Normal heart sounds.  Pulmonary:     Effort: Pulmonary effort is normal.     Breath sounds: Normal breath sounds.  Abdominal:     Palpations: Abdomen is soft.     Tenderness: There is no abdominal tenderness.  Genitourinary:    Prostate: Normal.     Rectum: Normal.  Musculoskeletal:        General: Normal range of motion.     Cervical back: Normal range of  motion and neck supple. No tenderness.     Right lower leg: No edema.     Left lower leg: No edema.  Lymphadenopathy:     Cervical: No cervical adenopathy.  Skin:    General: Skin is warm and dry.  Neurological:     General: No focal deficit present.     Mental Status: He is alert and oriented to person, place, and time.     Cranial Nerves: No cranial nerve deficit.     Coordination: Coordination normal.     Gait: Gait normal.  Psychiatric:        Mood and Affect: Mood normal.        Behavior: Behavior normal.        Thought Content: Thought content normal.        Judgment: Judgment normal.     Results for orders placed or performed in visit on 01/17/22  [facility] HIV SCREENING LAB  Result Value Ref Range   [facility] HIV Screening Negative - Patient reported   [facility] HEPATITIS C SCREENING LAB  Result Value Ref Range   [facility] Hepatitis Screen Negative - Patient Reported       Assessment & Plan:   Problem List Items Addressed This Visit       Cardiovascular and Mediastinum  Hypertension    Chronic, stable. His insurance changed and no longer covers telmisartan. Will switch him to valsartan 160mg  daily. Encouraged him to check his blood pressure. Follow-up in 1-2 months. Check CMP, CBC, and lipid panel today.       Relevant Medications   valsartan (DIOVAN) 160 MG tablet   Other Relevant Orders   CBC   Comprehensive metabolic panel   Lipid panel     Respiratory   Sleep apnea    Chronic, stable. Encouraged him to use his CPAP regularly. He has not used it in the last 2 weeks.         Other   Current moderate episode of major depressive disorder without prior episode (HCC)    Chronic, not controlled. He states that his irritability has gotten worse over the past few months. Will increase his wellbutrin to 450mg  daily. Continue duloxetine 60mg  daily and trazodone 100mg  as needed at bedtime. If symptoms are still not controlled, will place referral to psychiatry.       Relevant  Medications   buPROPion (WELLBUTRIN XL) 150 MG 24 hr tablet   Prediabetes    Last A1c was 6%. Will recheck A1c today. His insurance changed and is no longer covering ozempic as he does not have diabetes. Discussed nutrition.       Relevant Orders   Comprehensive metabolic panel   Hemoglobin A1c   Morbid obesity (HCC)    BMI 51.2. Discussed nutrition, exercise. New insurance will not cover semaglutide since he does not have diabetes.       Difficulty urinating    He has noticed trouble starting his urinary stream and urinary frequency. Will check PSA levels today. Start flomax 0.4mg  daily. Follow-up in 1-2 months.       Relevant Orders   PSA   Other Visit Diagnoses     Routine general medical examination at a health care facility    -  Primary   Health maintenance reviewed and updated. Discussed nutrition, exercise. Check CMP, CBC. Follow-up 1 year   Relevant Orders   CBC   Comprehensive metabolic panel   Skin lesion       Nevus that is getting larger on right cheek. Will place referral to dermatology.   Relevant Orders   Ambulatory referral to Dermatology       IMMUNIZATIONS:   - Tdap: Tetanus vaccination status reviewed: last tetanus booster within 10 years. - Influenza: Up to date - Pneumovax: Not applicable - Prevnar: Not applicable - HPV: Not applicable - Zostavax vaccine: Not applicable  SCREENING: - Colonoscopy: Not applicable  Discussed with patient purpose of the colonoscopy is to detect colon cancer at curable precancerous or early stages   - AAA Screening: Not applicable  -Hearing Test: Not applicable  -Spirometry: Not applicable   PATIENT COUNSELING:    Sexuality: Discussed sexually transmitted diseases, partner selection, use of condoms, avoidance of unintended pregnancy  and contraceptive alternatives.   Advised to avoid cigarette smoking.  I discussed with the patient that most people either abstain from alcohol or drink within safe limits  (<=14/week and <=4 drinks/occasion for males, <=7/weeks and <= 3 drinks/occasion for females) and that the risk for alcohol disorders and other health effects rises proportionally with the number of drinks per week and how often a drinker exceeds daily limits.  Discussed cessation/primary prevention of drug use and availability of treatment for abuse.   Diet: Encouraged to adjust caloric intake to maintain  or achieve ideal body weight,  to reduce intake of dietary saturated fat and total fat, to limit sodium intake by avoiding high sodium foods and not adding table salt, and to maintain adequate dietary potassium and calcium preferably from fresh fruits, vegetables, and low-fat dairy products.    stressed the importance of regular exercise  Injury prevention: Discussed safety belts, safety helmets, smoke detector, smoking near bedding or upholstery.   Dental health: Discussed importance of regular tooth brushing, flossing, and dental visits.   Follow up plan: NEXT PREVENTATIVE PHYSICAL DUE IN 1 YEAR. Return in about 2 months (around 03/20/2022) for Anxiety, Depression.

## 2022-01-17 NOTE — Patient Instructions (Addendum)
It was great to see you!  We are checking your labs today and will let you know the results via mychart/phone.   Switch your telmisartan to valsartan 1 tablet daily. Start checking your blood pressure at home.   Increase your wellbutrin to 450mg  daily, you will need to take 1 - 150mg  tablet and 1 - 300mg  tablet.   Start flomax 1 tablet daily after dinner  Let's follow-up in 2 months, sooner if you have concerns.  If a referral was placed today, you will be contacted for an appointment. Please note that routine referrals can sometimes take up to 3-4 weeks to process. Please call our office if you haven't heard anything after this time frame.  Take care,  , NP

## 2022-01-17 NOTE — Assessment & Plan Note (Signed)
Last A1c was 6%. Will recheck A1c today. His insurance changed and is no longer covering ozempic as he does not have diabetes. Discussed nutrition.

## 2022-01-17 NOTE — Assessment & Plan Note (Signed)
BMI 51.2. Discussed nutrition, exercise. New insurance will not cover semaglutide since he does not have diabetes.

## 2022-01-17 NOTE — Assessment & Plan Note (Signed)
He has noticed trouble starting his urinary stream and urinary frequency. Will check PSA levels today. Start flomax 0.4mg  daily. Follow-up in 1-2 months.

## 2022-01-17 NOTE — Assessment & Plan Note (Signed)
Chronic, not controlled. He states that his irritability has gotten worse over the past few months. Will increase his wellbutrin to 450mg  daily. Continue duloxetine 60mg  daily and trazodone 100mg  as needed at bedtime. If symptoms are still not controlled, will place referral to psychiatry.

## 2022-01-17 NOTE — Assessment & Plan Note (Addendum)
Chronic, stable. His insurance changed and no longer covers telmisartan. Will switch him to valsartan 160mg  daily. Encouraged him to check his blood pressure. Follow-up in 1-2 months. Check CMP, CBC, and lipid panel today.

## 2022-01-17 NOTE — Assessment & Plan Note (Signed)
Chronic, stable. Encouraged him to use his CPAP regularly. He has not used it in the last 2 weeks.

## 2022-01-29 DIAGNOSIS — Z419 Encounter for procedure for purposes other than remedying health state, unspecified: Secondary | ICD-10-CM | POA: Diagnosis not present

## 2022-03-01 DIAGNOSIS — Z419 Encounter for procedure for purposes other than remedying health state, unspecified: Secondary | ICD-10-CM | POA: Diagnosis not present

## 2022-03-06 ENCOUNTER — Other Ambulatory Visit: Payer: Self-pay | Admitting: Nurse Practitioner

## 2022-03-08 ENCOUNTER — Encounter: Payer: Self-pay | Admitting: Nurse Practitioner

## 2022-03-08 ENCOUNTER — Telehealth (INDEPENDENT_AMBULATORY_CARE_PROVIDER_SITE_OTHER): Payer: Medicaid Other | Admitting: Nurse Practitioner

## 2022-03-08 VITALS — Ht 72.0 in | Wt 352.0 lb

## 2022-03-08 DIAGNOSIS — F419 Anxiety disorder, unspecified: Secondary | ICD-10-CM | POA: Diagnosis not present

## 2022-03-08 DIAGNOSIS — F321 Major depressive disorder, single episode, moderate: Secondary | ICD-10-CM | POA: Diagnosis not present

## 2022-03-08 MED ORDER — ALPRAZOLAM 0.5 MG PO TABS: 0.5000 mg | ORAL_TABLET | Freq: Two times a day (BID) | ORAL | 0 refills | Status: DC | PRN

## 2022-03-08 NOTE — Assessment & Plan Note (Signed)
Chronic, not controlled. His symptoms worsened after being arrested and spending time in jail which have caused a PTSD reaction. His PHQ 9 is a 24 and his GAD 7 is an 18 today. He denies SI/HI. He is in the process of getting a therapist and he would like a referral to psychiatry. Will have him continue wellbutrin 450mg daily, cymbalta 60mg daily, and clonidine 0.1mg at bedtime. Will have him start xanax 0.5mg BID prn anxiety. PDMP reviewed. Referral placed to psychiatry. Keep next scheduled appointment next month.  

## 2022-03-08 NOTE — Assessment & Plan Note (Signed)
Chronic, not controlled. His symptoms worsened after being arrested and spending time in jail which have caused a PTSD reaction. His PHQ 9 is a 24 and his GAD 7 is an 18 today. He denies SI/HI. He is in the process of getting a therapist and he would like a referral to psychiatry. Will have him continue wellbutrin 450mg  daily, cymbalta 60mg  daily, and clonidine 0.1mg  at bedtime. Will have him start xanax 0.5mg  BID prn anxiety. PDMP reviewed. Referral placed to psychiatry. Keep next scheduled appointment next month.

## 2022-03-08 NOTE — Progress Notes (Signed)
[facility] [address] [place] [address] Dept: [phone] Dept Fax: [phone]  Virtual Video Visit  I connected with Peter Gutierrez on 03/08/22 at  2:20 PM EST by a video enabled telemedicine application and verified that I am speaking with the correct person using two identifiers.  Location patient: Home Location provider: Clinic Persons participating in the virtual visit: Patient; Peter Peper, NP; Mardelle Matte, CMA  I discussed the limitations of evaluation and management by telemedicine and the availability of in person appointments. The patient expressed understanding and agreed to proceed.  Chief Complaint  Patient presents with   Acute Visit    Discuss stress and anxiety and PTSD from jail . Has a lot of concerns related to his stress .    SUBJECTIVE:  HPI: Peter Gutierrez is a 42 y.o. male who presents with increasing stress, anxiety, and PTSD after being in jail. He states that on 03/04/22 he was arrested and was placed in jail. He was released and since then he has been jumping anytime he hears a door close, and sometimes when someone walks too close to him. He was having irritability and trouble with anxiety and depression even before this. He is currently taking wellbutrin 450mg  daily, duloxetine 60mg  daily, and clonidine 0.1mg  at bedtime. He denies SI/HI. He is in the process of getting set up with a therapist, however he would like a referral to psychiatry.      03/08/2022    2:17 PM 01/17/2022    8:34 AM 01/17/2022    8:12 AM 10/18/2021    8:54 AM 08/16/2021   10:33 AM  Depression screen PHQ 2/9  Decreased Interest 3 1 0 1 2  Down, Depressed, Hopeless 3 1 1 1 2   PHQ - 2 Score 6 2 1 2 4   Altered sleeping 3 1  2 2   Tired, decreased energy 3 1  0 2  Change in appetite 3 1  1 3   Feeling bad or failure about yourself  3 1  0 1  Trouble concentrating 3 0  0 1  Moving slowly or fidgety/restless 3 1   0 0  Suicidal thoughts 0 0  0 0  PHQ-9 Score 24 7  5 13   Difficult doing work/chores Very difficult Somewhat difficult Not difficult at all  Very difficult      03/08/2022    2:18 PM 01/17/2022    8:34 AM 10/18/2021    8:54 AM 08/16/2021   10:33 AM  GAD 7 : Generalized Anxiety Score  Nervous, Anxious, on Edge 3 1 2 1   Control/stop worrying 3 1 2 2   Worry too much - different things 3 1 2 2   Trouble relaxing 3 1 1 1   Restless 3 1 2  0  Easily annoyed or irritable 0 2 2 3   Afraid - awful might happen 3 1 1 2   Total GAD 7 Score 18 8 12 11   Anxiety Difficulty Not difficult at all Somewhat difficult Somewhat difficult Somewhat difficult    Patient Active Problem List   Diagnosis Date Noted   Morbid obesity ([place]) 01/17/2022   Difficulty urinating 01/17/2022   Erectile dysfunction 07/05/2021   Tinea cruris 07/05/2021   Dyspnea on exertion 06/07/2021   Current moderate episode of major depressive disorder without prior episode ([place]) 06/07/2021   Anxiety 06/07/2021   Prediabetes 06/07/2021   Sleep apnea 08/15/2018   Hypertension 08/15/2018    Past Surgical History:  Procedure Laterality  Date   CHOLECYSTECTOMY     EXTRACORPOREAL SHOCK WAVE LITHOTRIPSY Right 07/27/2019   Procedure: EXTRACORPOREAL SHOCK WAVE LITHOTRIPSY (ESWL);  Surgeon: Raynelle Bring, MD;  Location: [facility];  Service: Urology;  Laterality: Right;    Family History  Problem Relation Age of Onset   Kidney disease Mother    Diabetes Mother    Liver disease Mother    Stroke Father    Diabetes Father    Arthritis Father    Heart disease Father    Hypertension Father    Hyperlipidemia Father    Heart attack Father    GI Bleed Father    Colon cancer Maternal Grandfather    Heart disease Maternal Grandfather    Hypertension Maternal Grandfather    Hyperlipidemia Maternal Grandfather    Heart attack Maternal Grandfather     Social History   Tobacco Use   Smoking status: Former     Packs/day: 0.50    Years: 7.00    Total pack years: 3.50    Types: Cigarettes    Quit date: 2011    Years since quitting: 13.1   Smokeless tobacco: Current    Types: Chew  Vaping Use   Vaping Use: Never used  Substance Use Topics   Alcohol use: Yes    Alcohol/week: 49.0 standard drinks of alcohol    Types: 49 Shots of liquor per week   Drug use: Not Currently     Current Outpatient Medications:    albuterol (VENTOLIN HFA) 108 (90 Base) MCG/ACT inhaler, Inhale into the lungs every 6 (six) hours as needed for wheezing or shortness of breath., Disp: , Rfl:    ALPRAZolam (XANAX) 0.5 MG tablet, Take 1 tablet (0.5 mg total) by mouth 2 (two) times daily as needed for anxiety., Disp: 30 tablet, Rfl: 0   aspirin 325 MG tablet, Take 325 mg by mouth daily., Disp: , Rfl:    buPROPion (WELLBUTRIN XL) 150 MG 24 hr tablet, Take 1 tablet (150 mg total) by mouth daily. Take with 300mg  dose to equal 450mg  daily, Disp: 90 tablet, Rfl: 1   buPROPion (WELLBUTRIN XL) 300 MG 24 hr tablet, Take 1 tablet (300 mg total) by mouth daily., Disp: 90 tablet, Rfl: 1   cloNIDine (CATAPRES) 0.1 MG tablet, Take 1 tablet (0.1 mg total) by mouth at bedtime., Disp: 90 tablet, Rfl: 1   Clotrimazole 1 % OINT, Apply 1 application. topically daily., Disp: 56.7 g, Rfl: 2   DULoxetine (CYMBALTA) 60 MG capsule, Take 1 capsule (60 mg total) by mouth daily., Disp: 90 capsule, Rfl: 1   meloxicam (MOBIC) 15 MG tablet, Take 1 tablet (15 mg total) by mouth daily as needed., Disp: 90 tablet, Rfl: 1   metoprolol succinate (TOPROL-XL) 100 MG 24 hr tablet, Take 1 tablet (100 mg total) by mouth daily. Take with or immediately following a meal., Disp: 90 tablet, Rfl: 1   Multiple Vitamin (MULTIVITAMIN ADULT PO), Take by mouth., Disp: , Rfl:    Omega-3 Fatty Acids (FISH OIL OMEGA-3 PO), Take by mouth., Disp: , Rfl:    omeprazole (PRILOSEC) 20 MG capsule, Take 1 capsule (20 mg total) by mouth daily., Disp: 90 capsule, Rfl: 1   tadalafil  (CIALIS) 20 MG tablet, Take 0.5-1 tablets (10-20 mg total) by mouth every other day as needed for erectile dysfunction., Disp: 10 tablet, Rfl: 11   tamsulosin (FLOMAX) 0.4 MG CAPS capsule, Take 1 capsule (0.4 mg total) by mouth daily., Disp: 30 capsule, Rfl: 2  traZODone (DESYREL) 100 MG tablet, Take 1 tablet (100 mg total) by mouth at bedtime., Disp: 90 tablet, Rfl: 1   valsartan (DIOVAN) 160 MG tablet, Take 1 tablet (160 mg total) by mouth daily., Disp: 90 tablet, Rfl: 1  No Known Allergies  ROS: See pertinent positives and negatives per HPI.  OBSERVATIONS/OBJECTIVE:  VITALS per patient if applicable: Today's Vitals   03/08/22 1420  Weight: (!) 352 lb (159.7 kg)  Height: 6' (1.829 m)   Body mass index is 47.74 kg/m.    GENERAL: Alert and oriented. Appears well and in no acute distress.  HEENT: Atraumatic. Conjunctiva clear. No obvious abnormalities on inspection of external nose and ears.  NECK: Normal movements of the head and neck.  LUNGS: On inspection, no signs of respiratory distress. Breathing rate appears normal. No obvious gross SOB, gasping or wheezing, and no conversational dyspnea.  CV: No obvious cyanosis.  MS: Moves all visible extremities without noticeable abnormality.  PSYCH/NEURO: Pleasant and cooperative. No obvious depression or anxiety. Speech and thought processing grossly intact.  ASSESSMENT AND PLAN:  Problem List Items Addressed This Visit       Other   Current moderate episode of major depressive disorder without prior episode ([place]) - Primary    Chronic, not controlled. His symptoms worsened after being arrested and spending time in jail which have caused a PTSD reaction. His PHQ 9 is a 24 and his GAD 7 is an 18 today. He denies SI/HI. He is in the process of getting a therapist and he would like a referral to psychiatry. Will have him continue wellbutrin 450mg  daily, cymbalta 60mg  daily, and clonidine 0.1mg  at bedtime. Will have him start xanax  0.5mg  BID prn anxiety. PDMP reviewed. Referral placed to psychiatry. Keep next scheduled appointment next month.       Relevant Medications   ALPRAZolam (XANAX) 0.5 MG tablet   Other Relevant Orders   Ambulatory referral to Psychiatry   Anxiety    Chronic, not controlled. His symptoms worsened after being arrested and spending time in jail which have caused a PTSD reaction. His PHQ 9 is a 24 and his GAD 7 is an 18 today. He denies SI/HI. He is in the process of getting a therapist and he would like a referral to psychiatry. Will have him continue wellbutrin 450mg  daily, cymbalta 60mg  daily, and clonidine 0.1mg  at bedtime. Will have him start xanax 0.5mg  BID prn anxiety. PDMP reviewed. Referral placed to psychiatry. Keep next scheduled appointment next month.       Relevant Medications   ALPRAZolam (XANAX) 0.5 MG tablet   Other Relevant Orders   Ambulatory referral to Psychiatry     I discussed the assessment and treatment plan with the patient. The patient was provided an opportunity to ask questions and all were answered. The patient agreed with the plan and demonstrated an understanding of the instructions.   The patient was advised to call back or seek an in-person evaluation if the symptoms worsen or if the condition fails to improve as anticipated.   Charyl Dancer, NP

## 2022-03-08 NOTE — Patient Instructions (Signed)
It was great to see you!  I have placed a referral to psychiatry. Start xanax 1 tablet twice a day as needed for anxiety.   Let's follow-up at your next scheduled visit next month.   Take care,  Vance Peper, NP

## 2022-03-24 ENCOUNTER — Other Ambulatory Visit: Payer: Self-pay | Admitting: Nurse Practitioner

## 2022-03-30 DIAGNOSIS — Z419 Encounter for procedure for purposes other than remedying health state, unspecified: Secondary | ICD-10-CM | POA: Diagnosis not present

## 2022-04-11 ENCOUNTER — Ambulatory Visit (INDEPENDENT_AMBULATORY_CARE_PROVIDER_SITE_OTHER): Payer: Medicaid Other | Admitting: Nurse Practitioner

## 2022-04-11 ENCOUNTER — Encounter: Payer: Self-pay | Admitting: Nurse Practitioner

## 2022-04-11 VITALS — BP 130/86 | HR 79 | Temp 98.0°F | Ht 72.0 in | Wt 356.0 lb

## 2022-04-11 DIAGNOSIS — I10 Essential (primary) hypertension: Secondary | ICD-10-CM

## 2022-04-11 DIAGNOSIS — F419 Anxiety disorder, unspecified: Secondary | ICD-10-CM | POA: Diagnosis not present

## 2022-04-11 DIAGNOSIS — F321 Major depressive disorder, single episode, moderate: Secondary | ICD-10-CM

## 2022-04-11 LAB — HEMOGLOBIN A1C: Hemoglobin A1C: 5.7

## 2022-04-11 NOTE — Progress Notes (Unsigned)
   Established Patient Office Visit  Subjective   Patient ID: Peter Gutierrez, male    DOB: 1980/07/06  Age: 42 y.o. MRN: [id number]  Chief Complaint  Patient presents with   Depression    Follow up and check on referral to psychiatry    HPI  {History (Optional):23778}  ROS    Objective:     BP (!) 140/86 (BP Location: Left Arm)   Pulse 79   Temp 98 F (36.7 C)   Ht 6' (1.829 m)   Wt (!) 356 lb (161.5 kg)   SpO2 96%   BMI 48.28 kg/m  BP Readings from Last 3 Encounters:  04/11/22 (!) 140/86  01/17/22 126/86  10/18/21 119/82   Wt Readings from Last 3 Encounters:  04/11/22 (!) 356 lb (161.5 kg)  03/08/22 (!) 352 lb (159.7 kg)  01/17/22 (!) 378 lb (171.5 kg)      Physical Exam   No results found for any visits on 04/11/22.  {Labs (Optional):23779}  The ASCVD Risk score (Arnett DK, et al., 2019) failed to calculate for the following reasons:   The valid total cholesterol range is 130 to 320 mg/dL    Assessment & Plan:   Problem List Items Addressed This Visit   None   No follow-ups on file.    Charyl Dancer, NP

## 2022-04-11 NOTE — Patient Instructions (Signed)
It was great to see you!  I have refilled your xanax.   Let's follow-up in 3 months, sooner if you have concerns.  If a referral was placed today, you will be contacted for an appointment. Please note that routine referrals can sometimes take up to 3-4 weeks to process. Please call our office if you haven't heard anything after this time frame.  Take care,  Vance Peper, NP

## 2022-04-12 MED ORDER — ALPRAZOLAM 0.5 MG PO TABS: 0.5000 mg | ORAL_TABLET | Freq: Two times a day (BID) | ORAL | 0 refills | Status: AC | PRN

## 2022-04-12 NOTE — Assessment & Plan Note (Signed)
Chronic, not controlled.  His symptoms are still worsening with depression and anxiety with everything going on in his personal life.  Provided him the number to call to schedule an appointment with psychiatry.  Will have him continue Wellbutrin 450 mg daily, Cymbalta 60 mg daily and clonidine 0.1 mg at bedtime.  Encouraged him to reach out as soon as possible to psychiatry.  Follow-up in 3 months or sooner with concerns.

## 2022-04-12 NOTE — Assessment & Plan Note (Signed)
Chronic, not controlled.  He is having a lot of stress going on in his life right now.  He did establish with a therapist and has seen them once.  Gave him the phone number to call to schedule an appointment with psychiatry.  Will have him continue Xanax 0.5 mg twice a day as needed.  PDMP reviewed.  Follow-up in 3 months.

## 2022-04-12 NOTE — Assessment & Plan Note (Signed)
He has lost 22 pounds since his last visit.  Continue focusing on nutrition and exercise.  Some of this may be related to stress.

## 2022-04-12 NOTE — Assessment & Plan Note (Signed)
Chronic, stable.  BP today 130/86.  Continue metoprolol XL 100 mg daily and valsartan 160 mg daily.

## 2022-04-23 ENCOUNTER — Other Ambulatory Visit: Payer: Self-pay | Admitting: Nurse Practitioner

## 2022-04-25 ENCOUNTER — Encounter (HOSPITAL_COMMUNITY): Payer: Self-pay | Admitting: Psychiatry

## 2022-04-25 ENCOUNTER — Ambulatory Visit (HOSPITAL_BASED_OUTPATIENT_CLINIC_OR_DEPARTMENT_OTHER): Payer: Medicaid Other | Admitting: Psychiatry

## 2022-04-25 ENCOUNTER — Telehealth (HOSPITAL_COMMUNITY): Payer: Self-pay | Admitting: *Deleted

## 2022-04-25 VITALS — Wt 356.0 lb

## 2022-04-25 DIAGNOSIS — F419 Anxiety disorder, unspecified: Secondary | ICD-10-CM | POA: Diagnosis not present

## 2022-04-25 DIAGNOSIS — F39 Unspecified mood [affective] disorder: Secondary | ICD-10-CM | POA: Diagnosis not present

## 2022-04-25 MED ORDER — DULOXETINE HCL 30 MG PO CPEP: 30.0000 mg | ORAL_CAPSULE | Freq: Every day | ORAL | 0 refills | Status: DC

## 2022-04-25 MED ORDER — LAMOTRIGINE 25 MG PO TABS: ORAL_TABLET | ORAL | 0 refills | Status: DC

## 2022-04-25 NOTE — Telephone Encounter (Signed)
Referrals for ADHD testing sent to [facility], [facility], and [facility].

## 2022-04-25 NOTE — Progress Notes (Signed)
Psychiatric Initial Adult Assessment   Virtual Visit via Video Note  I connected with Peter Gutierrez on 04/25/22 at  9:00 AM EDT by a video enabled telemedicine application and verified that I am speaking with the correct person using two identifiers.  Location: Patient: Home Provider: Home Office   I discussed the limitations of evaluation and management by telemedicine and the availability of in person appointments. The patient expressed understanding and agreed to proceed.  Patient Identification: Peter Gutierrez MRN:  [id number] Date of Evaluation:  04/25/2022 Referral Source: PCP  Chief Complaint:   Chief Complaint  Patient presents with   Establish Care   Visit Diagnosis:    ICD-10-CM   1. Mood disorder (HCC)  F39 lamoTRIgine (LAMICTAL) 25 MG tablet    DULoxetine (CYMBALTA) 30 MG capsule    2. Anxiety  F41.9 lamoTRIgine (LAMICTAL) 25 MG tablet      History of Present Illness: Patient is 42 years old Caucasian, married currently in the school is referred from primary care physician for the management of his psychiatric symptoms.  Patient reported struggling with depression and anxiety since teenage.  He has a history of being bullied due to his overweight and being quite in the school.  Patient reported lately he is having a lot of stressors.  He is dealing court case as he is charged for felony of child abuse and violation of release.  Patient told it is a complex and complicated family situation.  He lives with his wife and 7 children however 4 of them are adopted from [place].  Patient told his wife is the adopted mother and he is in the process of getting her adoption.  Patient told 2 months ago him and his wife were arrested after case file by child protective services of abuse.  Patient told they are not dealing with this case and it is quite stressful.  The next court date is on April 10.  Patient told social worker has removed the kids because there is some disagreement and  lying.  Patient has attorney who is working on their cases.  He reported poor sleep, feeling tired, isolated, withdrawn and lately having some crying spells.  He thinks all about his past that he is a failure.  He feels sometimes hopeless, worthless and lately more irritable and frustrated.  He admitted a lot of procrastination, pessimism and ruminative thoughts.  He is taking psychotropic medication from his physician assistant but despite taking multiple medication he has not seen significant improvement.  He feels tired all the time.  He also reported irritability, impulsive behavior with eating, sometimes excessive buying and excessive energy.  He reported history of heavy drinking from year 2002- 2023 but he stopped drinking cold [place].  His primary care recommended therapy but he had not establish the care yet through [facility].  He had prescribed sleep apnea but he admitted noncompliant with machine.  He denies any hallucination but admitted some time feeling paranoid and when dog barks he feels the police came to arrest him.  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  This is his third semester.  Patient told he used to work a lot in the past but had to quit multiple jobs because of the stress, depression or unable to complete the task.  He admitted decreased attention, concentration, fatigue, irritability, getting easily distracted and difficulty completing the task.  He was never tested for ADHD.  Denies any suicidal thoughts.  He had a distant history of smoking marijuana and there are times he used Gummies but claimed to be sober from illegal substance use.  He admitted weight fluctuation and in the past he had tried Ozempic for weight loss but lately he is watching his calorie intake and trying to lose weight with a better diet plan.  Denies any nightmares, flashbacks, aggression, violence.  He admitted history of mood swing, anger,  frustration and there are times when he had get out of control and punch the house walls.  However he never hit anyone and never hurt himself.  He is very self-conscious about his weight as he had a history of being bullied for his weight in the school.  He finished high school and then he tried doing several things but never able to finish.  Like to focus on his education and so far he is making better grades.  He has no tremors, shakes or any EPS.  He is taking Wellbutrin 450 mg, Cymbalta 60 mg daily, clonidine 0.1 mg at bedtime, trazodone 100 mg at bedtime and Xanax 0.5 mg twice a day as needed.  Lives with his wife and he has 3 biological children and 4 adopted children.  This is his third marriage.  He has a 20-year from his first marriage and he has 42 and 43 year old from his second marriage.  He has no children from his third marriage.  His wife adopted 4 children from [place] who are 71 year old, 43 year old, 43 year old and 94 year old.  His wife works in Administrator, sports.  Reviewed all the screenings which was done recently.   Associated Signs/Symptoms: Depression Symptoms:  depressed mood, insomnia, fatigue, feelings of worthlessness/guilt, difficulty concentrating, anxiety, disturbed sleep, (Hypo) Manic Symptoms:  Distractibility, Impulsivity, Irritable Mood, Labiality of Mood, Anxiety Symptoms:  Excessive Worry, Social Anxiety, Psychotic Symptoms:  Paranoia, PTSD Symptoms: Had a traumatic exposure:  History of being bullied for his weight in the school. Avoidance:  Decreased Interest/Participation  Past Psychiatric History: History of seeing psychiatrist and therapist at the [facility] in [place].  No history of suicidal attempt but reported history of depression, anxiety, believed in the school.  As per EMR he tried BuSpar, Lexapro, Prozac, nortriptyline.  History of mood swing, irritability, punching the wall, excessive buying and impulsive eating.  He is currently charged for felony of  child abuse.  No history of seizures.  History of heavy drinking from the year 2002 2023.  No history of blackouts, DUI or any illegal substance use.  For past 2 years medicines are prescribed by primary care.  Previous Psychotropic Medications: Yes   Substance Abuse History in the last 12 months:  Yes.    Consequences of Substance Abuse: NA  Past Medical History:  Past Medical History:  Diagnosis Date   Elevated liver function tests    Fatty liver    Hypertension    Kidney stone    Morbid obesity (HCC)    PONV (postoperative nausea and vomiting)    Prediabetes    Sinus tachycardia    Sleep apnea     Past Surgical History:  Procedure Laterality Date   CHOLECYSTECTOMY     EXTRACORPOREAL SHOCK WAVE LITHOTRIPSY Right 07/27/2019   Procedure: EXTRACORPOREAL SHOCK WAVE LITHOTRIPSY (ESWL);  Surgeon: Raynelle Bring, MD;  Location: [facility];  Service: Urology;  Laterality: Right;    Family Psychiatric History: Mother has depression and anxiety. Younger brother has mental disorder and admitted multiple times for inpatient and tried to commit suicide.  Family History:  Family History  Problem Relation Age of Onset   Kidney disease Mother    Diabetes Mother    Liver disease Mother    Stroke Father    Diabetes Father    Arthritis Father    Heart disease Father    Hypertension Father    Hyperlipidemia Father    Heart attack Father    GI Bleed Father    Colon cancer Maternal Grandfather    Heart disease Maternal Grandfather    Hypertension Maternal Grandfather    Hyperlipidemia Maternal Grandfather    Heart attack Maternal Grandfather     Social History:   Social History   Socioeconomic History   Marital status: Married    Spouse name: Not on file   Number of children: Not on file   Years of education: Not on file   Highest education level: Not on file  Occupational History   Not on file  Tobacco Use   Smoking status: Former    Packs/day: 0.50     Years: 7.00    Additional pack years: 0.00    Total pack years: 3.50    Types: Cigarettes    Quit date: 2011    Years since quitting: 13.2   Smokeless tobacco: Current    Types: Chew  Vaping Use   Vaping Use: Never used  Substance and Sexual Activity   Alcohol use: Yes    Alcohol/week: 49.0 standard drinks of alcohol    Types: 49 Shots of liquor per week   Drug use: Not Currently   Sexual activity: Yes  Other Topics Concern   Not on file  Social History Narrative   Not on file   Social Determinants of Health   Financial Resource Strain: Not on file  Food Insecurity: Not on file  Transportation Needs: Not on file  Physical Activity: Not on file  Stress: Not on file  Social Connections: Not on file    Additional Social History: Patient born and raised in [place].  This is his third marriage.  Patient told his first marriage did not last long and he does not want to talk about it.  His second marriage lasted for 15 years but it fall apart after patient spending more time dealing with the custody of his first child from his previous marriage.  Patient has 2 kids from his second marriage.  He has no children from his current marriage.  His wife adopted 4 children from the [place].  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  Allergies:  No Known Allergies  Metabolic Disorder Labs: Lab Results  Component Value Date   HGBA1C 5.7 04/11/2022   No results found for: "PROLACTIN" Lab Results  Component Value Date   CHOL 104 01/17/2022   TRIG 128.0 01/17/2022   HDL 34.80 (L) 01/17/2022   CHOLHDL 3 01/17/2022   VLDL 25.6 01/17/2022   LDLCALC 43 01/17/2022   LDLCALC 61 01/17/2021   Lab Results  Component Value Date   TSH 1.71 06/07/2021    Therapeutic Level Labs: No results found for: "LITHIUM" No results found for: "CBMZ" No results found for: "VALPROATE"  Current Medications: Current Outpatient Medications   Medication Sig Dispense Refill   albuterol (VENTOLIN HFA) 108 (90 Base) MCG/ACT inhaler Inhale into the lungs every 6 (six) hours as needed for wheezing or shortness of breath.     ALPRAZolam (XANAX) 0.5 MG tablet Take 1 tablet (0.5 mg total) by mouth  2 (two) times daily as needed for anxiety. 60 tablet 0   aspirin 325 MG tablet Take 325 mg by mouth daily.     buPROPion (WELLBUTRIN XL) 150 MG 24 hr tablet Take 1 tablet (150 mg total) by mouth daily. Take with 300mg  dose to equal 450mg  daily 90 tablet 1   buPROPion (WELLBUTRIN XL) 300 MG 24 hr tablet Take 1 tablet (300 mg total) by mouth daily. 90 tablet 1   cloNIDine (CATAPRES) 0.1 MG tablet Take 1 tablet (0.1 mg total) by mouth at bedtime. 90 tablet 1   Clotrimazole 1 % OINT Apply 1 application. topically daily. (Patient not taking: Reported on 04/11/2022) 56.7 g 2   DULoxetine (CYMBALTA) 60 MG capsule Take 1 capsule (60 mg total) by mouth daily. 90 capsule 1   meloxicam (MOBIC) 15 MG tablet Take 1 tablet (15 mg total) by mouth daily as needed. 90 tablet 1   metoprolol succinate (TOPROL-XL) 100 MG 24 hr tablet Take 1 tablet (100 mg total) by mouth daily. Take with or immediately following a meal. 90 tablet 1   Multiple Vitamin (MULTIVITAMIN ADULT PO) Take by mouth.     Omega-3 Fatty Acids (FISH OIL OMEGA-3 PO) Take by mouth.     omeprazole (PRILOSEC) 20 MG capsule Take 1 capsule (20 mg total) by mouth daily. 90 capsule 1   tadalafil (CIALIS) 20 MG tablet Take 0.5-1 tablets (10-20 mg total) by mouth every other day as needed for erectile dysfunction. 10 tablet 11   tamsulosin (FLOMAX) 0.4 MG CAPS capsule Take 1 capsule (0.4 mg total) by mouth daily. 30 capsule 2   traZODone (DESYREL) 100 MG tablet Take 1 tablet (100 mg total) by mouth at bedtime. 90 tablet 1   valsartan (DIOVAN) 160 MG tablet Take 1 tablet (160 mg total) by mouth daily. 90 tablet 1   No current facility-administered medications for this visit.    Musculoskeletal: Strength &  Muscle Tone: within normal limits Gait & Station: normal Patient leans: N/A  Psychiatric Specialty Exam: Review of Systems  Weight (!) 356 lb (161.5 kg).There is no height or weight on file to calculate BMI.  General Appearance: Fairly Groomed  Eye Contact:  Fair  Speech:  Slow  Volume:  Decreased  Mood:   tired  Affect:  Constricted and Depressed  Thought Process:  Descriptions of Associations: Intact  Orientation:  Full (Time, Place, and Person)  Thought Content:  Paranoid Ideation and Rumination  Suicidal Thoughts:  No  Homicidal Thoughts:  No  Memory:  Immediate;   Fair Recent;   Fair Remote;   Fair  Judgement:  Fair  Insight:  Fair  Psychomotor Activity:  Decreased  Concentration:  Concentration: Fair and Attention Span: Fair  Recall:  Good  Fund of Knowledge:Good  Language: Good  Akathisia:  No  Handed:  Right  AIMS (if indicated):  not done  Assets:  Communication Skills Desire for Improvement Housing Social Support Transportation  ADL's:  Intact  Cognition: WNL  Sleep:  Fair   Screenings: GAD-7    Personnel officer Visit from 04/11/2022 in [place] at [facility] Video Visit from 03/08/2022 in [place] at [facility] Visit from 01/17/2022 in [place] at [facility] Visit from 10/18/2021 in [place] at [facility] Visit from 08/16/2021 in [place] at [facility]  Total GAD-7 Score 18 18 8 12  11  [place] Office Visit from 04/11/2022 in [place] at [facility] Video Visit from 03/08/2022 in [place] at [facility] Visit from 01/17/2022 in [place] at [facility] Visit from 10/18/2021 in [place] at [facility] Visit from 08/16/2021 in [place] at [facility]  PHQ-2 Total Score 4 6 2 2 4   PHQ-9 Total Score 18 24 7 5 13        Assessment and Plan: Peter Gutierrez is a 42 year old Caucasian man with history of psychotic symptoms.  I review psychosocial stressors, current medication, blood work results, medical history.  Currently he is taking moderate dose and multiple medication for his psychiatric symptoms.  He is taking Wellbutrin XL total dose 450, Cymbalta 60 mg daily, clonidine 0.1 mg at bedtime, trazodone 100 mg at bedtime and Xanax 0.5 mg twice a day.  I discussed in detail about his current medication, continue having symptoms including difficulty attention, concentration, distractibility, impulsivity, fatigue.  Patient is agreed to do some medication adjustments since he feels current medicine is not working.  We talk about possibly underlying mood disorder.  Recommend to try Lamictal which he had never tried before..  Lamictal 25 mg daily for 1 week and then 50 mg daily.  I recommend to cut down the Cymbalta 30 mg as currently taking 60 mg and eventually we will discontinue in the future.  I also recommend to decrease Wellbutrin 300 from 450.  It is unclear why clonidine is given as I did not see any formal diagnosis of ADHD.  However I do recommend to have psychological testing to rule out ADHD symptoms.  We will refer him for psychological testing.  He is taking clonidine and trazodone for sleep but also noncompliant with his CPAP.  I encouraged to use CPAP machine to help sleep.  For now he will continue clonidine and trazodone but eventually may need medication adjustment if he still struggle with insomnia.  I also believe he need to see a therapist as going through a lot in his family life.  I encouraged to keep appointment with [facility] and if he has any issues getting appointment then he can call us.  Discussed medication side effects and benefits specially if he had a rash with Lamictal then he need to stop the medication  immediately.  His Wellbutrin, Xanax, clonidine and trazodone are given by his PCP.  We will provide low-dose Cymbalta 30 mg and Lamictal 25 mg for 1 week and then 50 mg daily.  Discussed safety concern that anytime having active suicidal thoughts or homicidal halogen need to call 911 or go to local emergency room.  Follow-up in 3 weeks.  I will also forward my note to his PCP.  Collaboration of Care: Other provider involved in patient's care AEB notes are available in epic to review.  Patient/Guardian was advised Release of Information must be obtained prior to any record release in order to collaborate their care with an outside provider. Patient/Guardian was advised if they have not already done so to contact the registration department to sign all necessary forms in order for [place] to release information regarding their care.   Consent: Patient/Guardian gives verbal consent for treatment and assignment of benefits for services provided during this visit. Patient/Guardian expressed understanding and agreed to proceed.   Kathlee Nations, MD 3/27/20249:05 AM

## 2022-04-30 DIAGNOSIS — Z419 Encounter for procedure for purposes other than remedying health state, unspecified: Secondary | ICD-10-CM | POA: Diagnosis not present

## 2022-05-07 ENCOUNTER — Other Ambulatory Visit: Payer: Self-pay | Admitting: Nurse Practitioner

## 2022-05-18 ENCOUNTER — Telehealth (HOSPITAL_BASED_OUTPATIENT_CLINIC_OR_DEPARTMENT_OTHER): Payer: Medicaid Other | Admitting: Psychiatry

## 2022-05-18 ENCOUNTER — Encounter (HOSPITAL_COMMUNITY): Payer: Self-pay | Admitting: Psychiatry

## 2022-05-18 VITALS — Wt 356.0 lb

## 2022-05-18 DIAGNOSIS — F419 Anxiety disorder, unspecified: Secondary | ICD-10-CM | POA: Diagnosis not present

## 2022-05-18 DIAGNOSIS — F902 Attention-deficit hyperactivity disorder, combined type: Secondary | ICD-10-CM

## 2022-05-18 DIAGNOSIS — F39 Unspecified mood [affective] disorder: Secondary | ICD-10-CM | POA: Diagnosis not present

## 2022-05-18 MED ORDER — DULOXETINE HCL 30 MG PO CPEP
30.0000 mg | ORAL_CAPSULE | Freq: Every day | ORAL | 1 refills | Status: DC
Start: 2022-05-18 — End: 2022-08-06

## 2022-05-18 MED ORDER — LAMOTRIGINE 100 MG PO TABS
ORAL_TABLET | ORAL | 1 refills | Status: DC
Start: 2022-05-18 — End: 2022-06-29

## 2022-05-18 NOTE — Progress Notes (Addendum)
[facility] MD Virtual Progress Note   Patient Location: Home Provider Location: Home Office  I connect with patient by video and verified that I am speaking with correct person by using two identifiers. I discussed the limitations of evaluation and management by telemedicine and the availability of in person appointments. I also discussed with the patient that there may be a patient responsible charge related to this service. The patient expressed understanding and agreed to proceed.  Peter Gutierrez [id number] 42 y.o.  05/18/2022 10:45 AM  History of Present Illness:  Patient is 42 year old Caucasian, admitted, college student who was referred from primary care for the management of anxiety and psychiatric symptoms.  He was seen almost 4 weeks ago and recommended to start Lamictal.  He is already taking Wellbutrin, Cymbalta, clonidine, trazodone and Xanax.  Struggle with irritability, mood swing, crying, impulsive behavior.  He admitted only taking Lamictal because he forgot to read the bottle.  So far no major concern or side effects including rash.  He sleeps fair.  He is supposed to use CPAP every day but he is not consistent.  Patient told court date is moved to June 12.  He is charged for child abuse felony.  He also wants to evaluated for ADHD and we have referred but he is waiting to schedule appointment.  Apparently the place he is referred does not take the insurance.  We also referred him therapy but he has not established the primary therapist either.  Reported dreams, nightmares and sometimes paranoid when he see policemen.  He also notices blinking and twitching but that has been done for a while and he believes is due to increased anxiety.  His wife is very supportive.  He is in school and is studying mortuary services.  He reported grades are good and better.  He admitted may have gained weight because not able to get the Ozempic and he was eating impulsively.  He feels  sometime irritability more than usual which could be due to reducing the dose of Cymbalta.  We also cut down his Wellbutrin from 450 mg to 300.  He is taking a moderate dose of Cymbalta and Wellbutrin.  He is taking trazodone, Xanax as needed and clonidine at bedtime.  He denies any hallucination, suicidal thoughts or homicidal thoughts.  Past Psychiatric History: H/O depression, anxiety, bullied in school and treatment at [facility]. No history of suicidal attempt. As per EMR tried BuSpar, Lexapro, Prozac, nortriptyline. H/O mood swing, irritability, punching the wall, excessive buying and impulsive eating. He is charged for child abuse felony. No history of seizures. H/O heavy ETOH from 2002 2023. No history of blackouts, DUI or any illegal substance use. Meds prescribed by primary care.    Outpatient Encounter Medications as of 05/18/2022  Medication Sig   albuterol (VENTOLIN HFA) 108 (90 Base) MCG/ACT inhaler Inhale into the lungs every 6 (six) hours as needed for wheezing or shortness of breath.   ALPRAZolam (XANAX) 0.5 MG tablet Take 1 tablet (0.5 mg total) by mouth 2 (two) times daily as needed for anxiety.   aspirin 325 MG tablet Take 325 mg by mouth daily.   buPROPion (WELLBUTRIN XL) 150 MG 24 hr tablet Take 1 tablet (150 mg total) by mouth daily. Take with 300mg  dose to equal 450mg  daily (Patient not taking: Reported on 04/25/2022)   buPROPion (WELLBUTRIN XL) 300 MG 24 hr tablet Take 1 tablet (300 mg total) by mouth daily.   cloNIDine (CATAPRES) 0.1  MG tablet Take 1 tablet (0.1 mg total) by mouth at bedtime.   Clotrimazole 1 % OINT Apply 1 application. topically daily. (Patient not taking: Reported on 04/11/2022)   DULoxetine (CYMBALTA) 30 MG capsule Take 1 capsule (30 mg total) by mouth daily.   lamoTRIgine (LAMICTAL) 25 MG tablet Take one tab daily for one week and than take two tab daily   meloxicam (MOBIC) 15 MG tablet Take 1 tablet (15 mg total) by mouth daily as needed.    metoprolol succinate (TOPROL-XL) 100 MG 24 hr tablet Take 1 tablet by mouth daily. Take with or immediately following a meal.   Multiple Vitamin (MULTIVITAMIN ADULT PO) Take by mouth.   Omega-3 Fatty Acids (FISH OIL OMEGA-3 PO) Take by mouth.   omeprazole (PRILOSEC) 20 MG capsule Take 1 capsule (20 mg total) by mouth daily.   tadalafil (CIALIS) 20 MG tablet Take 0.5-1 tablets (10-20 mg total) by mouth every other day as needed for erectile dysfunction.   tamsulosin (FLOMAX) 0.4 MG CAPS capsule Take 1 capsule (0.4 mg total) by mouth daily.   traZODone (DESYREL) 100 MG tablet Take 1 tablet (100 mg total) by mouth at bedtime.   valsartan (DIOVAN) 160 MG tablet Take 1 tablet (160 mg total) by mouth daily.   No facility-administered encounter medications on file as of 05/18/2022.    Recent Results (from the past 2160 hour(s))  Hemoglobin A1c     Status: None   Collection Time: 04/11/22 12:00 AM  Result Value Ref Range   Hemoglobin A1C 5.7      Psychiatric Specialty Exam: Physical Exam  Review of Systems  Weight (!) 356 lb (161.5 kg).There is no height or weight on file to calculate BMI.  General Appearance: Fairly Groomed  Eye Contact:  Fair  Speech:  Slow  Volume:  Decreased  Mood:  Anxious, Depressed, and Dysphoric  Affect:  Constricted  Thought Process:  Goal Directed  Orientation:  Full (Time, Place, and Person)  Thought Content:  Rumination  Suicidal Thoughts:  No  Homicidal Thoughts:  No  Memory:  Immediate;   Fair Recent;   Fair Remote;   Fair  Judgement:  Intact  Insight:  Present  Psychomotor Activity:  Decreased  Concentration:  Concentration: Fair and Attention Span: Fair  Recall:  [employer] of Knowledge:  Good  Language:  Good  Akathisia:  No  Handed:  Right  AIMS (if indicated):     Assets:  Communication Skills Desire for Improvement Housing Social Support Transportation  ADL's:  Intact  Cognition:  WNL  Sleep:  better     Assessment/Plan: Mood  disorder - Plan: lamoTRIgine (LAMICTAL) 100 MG tablet, DULoxetine (CYMBALTA) 30 MG capsule  Anxiety - Plan: lamoTRIgine (LAMICTAL) 100 MG tablet  Attention deficit hyperactivity disorder (ADHD), combined type  I discussed psychosocial stressors.  His court date is moved to June 12.  He is not consistent with the CPAP and also forgot to increase the Lamictal dose.  I recommend should take the Lamictal 50 mg for 1 week and then 75 mg for another week and then 100 mg daily to help his residual mood symptoms.  I recommend keep the Cymbalta 30 mg as plan is to take him off slowly and gradually as patient already taking Wellbutrin XL 300 mg daily.  He is also taking trazodone, clonidine and Xanax from the primary care.  Encourage to restart therapy to help his coping skills.  Patient also going to call the police to  get testing for rule out ADHD.  I encouraged to call us back if he has any question, concern or if he feel worsening of the symptoms.  Discussed safety concerns and any time having active suicidal thoughts or homicidal thought that he need to call 911 or go to local emergency room.  We will follow-up in 6 weeks.  Patient is also hoping to go back on Ozempic to help his weight loss.  I encouraged to watch his calorie intake, avoid impulsive eating and consider exercise and blocking.  Discussed healthy lifestyle.   Follow Up Instructions:     I discussed the assessment and treatment plan with the patient. The patient was provided an opportunity to ask questions and all were answered. The patient agreed with the plan and demonstrated an understanding of the instructions.   The patient was advised to call back or seek an in-person evaluation if the symptoms worsen or if the condition fails to improve as anticipated.    Collaboration of Care: Other provider involved in patient's care AEB notes are available in epic to review.  Patient/Guardian was advised Release of Information must be obtained  prior to any record release in order to collaborate their care with an outside provider. Patient/Guardian was advised if they have not already done so to contact the registration department to sign all necessary forms in order for [place] to release information regarding their care.   Consent: Patient/Guardian gives verbal consent for treatment and assignment of benefits for services provided during this visit. Patient/Guardian expressed understanding and agreed to proceed.     I provided 30 minutes of non face to face time during this encounter.  Note: This document was prepared by [employer] voice dictation technology and any errors that results from this process are unintentional.    Cleotis Nipper, MD 05/18/2022

## 2022-05-30 DIAGNOSIS — Z419 Encounter for procedure for purposes other than remedying health state, unspecified: Secondary | ICD-10-CM | POA: Diagnosis not present

## 2022-06-05 ENCOUNTER — Other Ambulatory Visit: Payer: Self-pay | Admitting: Nurse Practitioner

## 2022-06-05 NOTE — Telephone Encounter (Signed)
Refill request for  Melosicam 15 mg LR  10/10/21, #90, 1 rf  Bupropion XL 300 mg LR 10/10/21, #90, 1 rf LOV  04/11/22 FOV  07/16/22  Please review and advise.  Thanks. Dm/cma

## 2022-06-06 NOTE — Telephone Encounter (Signed)
Lvm TO Return call

## 2022-06-06 NOTE — Telephone Encounter (Signed)
Patient notified of below message.

## 2022-06-20 DIAGNOSIS — F331 Major depressive disorder, recurrent, moderate: Secondary | ICD-10-CM | POA: Diagnosis not present

## 2022-06-23 ENCOUNTER — Other Ambulatory Visit: Payer: Self-pay | Admitting: Nurse Practitioner

## 2022-06-29 ENCOUNTER — Encounter (HOSPITAL_COMMUNITY): Payer: Self-pay | Admitting: Psychiatry

## 2022-06-29 ENCOUNTER — Telehealth (HOSPITAL_BASED_OUTPATIENT_CLINIC_OR_DEPARTMENT_OTHER): Payer: Medicaid Other | Admitting: Psychiatry

## 2022-06-29 VITALS — Wt 354.0 lb

## 2022-06-29 DIAGNOSIS — F39 Unspecified mood [affective] disorder: Secondary | ICD-10-CM

## 2022-06-29 DIAGNOSIS — F902 Attention-deficit hyperactivity disorder, combined type: Secondary | ICD-10-CM | POA: Diagnosis not present

## 2022-06-29 DIAGNOSIS — F331 Major depressive disorder, recurrent, moderate: Secondary | ICD-10-CM | POA: Diagnosis not present

## 2022-06-29 DIAGNOSIS — F419 Anxiety disorder, unspecified: Secondary | ICD-10-CM | POA: Diagnosis not present

## 2022-06-29 MED ORDER — BUPROPION HCL ER (XL) 300 MG PO TB24
300.0000 mg | ORAL_TABLET | Freq: Every day | ORAL | 0 refills | Status: AC
Start: 2022-06-29 — End: ?

## 2022-06-29 MED ORDER — LAMOTRIGINE 150 MG PO TABS
ORAL_TABLET | ORAL | 1 refills | Status: DC
Start: 2022-06-29 — End: 2022-08-06

## 2022-06-29 NOTE — Progress Notes (Signed)
[facility] MD Virtual Progress Note   Patient Location: Home Provider Location: Home Office  I connect with patient by video and verified that I am speaking with correct person by using two identifiers. I discussed the limitations of evaluation and management by telemedicine and the availability of in person appointments. I also discussed with the patient that there may be a patient responsible charge related to this service. The patient expressed understanding and agreed to proceed.  Peter Gutierrez [id number] 42 y.o.  06/29/2022 10:26 AM  History of Present Illness:  Patient is evaluated by video session.  On the last visit we increased Lamictal 100 mg daily, cut down Wellbutrin 300 mg and also cut down the Cymbalta.  He reported his anxiety is better but is still have moments of depression, irritability and frustration.  He has a court date coming on June 12.  He was charged for child abuse felony.  He recently finished the anger management program.  He notices anger, violence, severe mood seen has been subsided but is still dealing with chronic depression.  His biggest concern is upcoming court date.  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  He reported school and grades are okay.  He lives with his wife who is supportive.  Patient has psychological testing today at 3:30 for ADHD at [facility] in [place].  He is going to keep the appointment.  Patient was getting medication from primary care with Wellbutrin XL 450, Cymbalta milligram, trazodone and Xanax.  He also getting clonidine.  His doses were adjusted and now taking Wellbutrin 300 mg, Cymbalta 30 mg and we started Lamictal.  He has no rash but noticed some dry skin on his arm but denies any inflammation, redness, itching.  Patient struggled with attention, focus, difficulty remembering things.  He gets easily distracted.  He feels the Lamictal is helping his mood and  irritability.  Past Psychiatric History: H/O depression, anxiety, bullied in school and treatment at [facility]. No history of suicidal attempt. As per EMR tried BuSpar, Lexapro, Prozac, nortriptyline. H/O mood swing, irritability, punching the wall, excessive buying and impulsive eating. He is charged for child abuse felony. No history of seizures. H/O heavy ETOH from 2002 2023. No history of blackouts, DUI or any illegal substance use. Meds prescribed by primary care.  Taking Wellbutrin XL 450 mg, Cymbalta 60 mg daily, clonidine 0.1 mg at bedtime, trazodone 100 mg at bedtime and Xanax 0.5 mg as needed.   Outpatient Encounter Medications as of 06/29/2022  Medication Sig   albuterol (VENTOLIN HFA) 108 (90 Base) MCG/ACT inhaler Inhale into the lungs every 6 (six) hours as needed for wheezing or shortness of breath.   ALPRAZolam (XANAX) 0.5 MG tablet Take 1 tablet (0.5 mg total) by mouth 2 (two) times daily as needed for anxiety.   aspirin 325 MG tablet Take 325 mg by mouth daily.   buPROPion (WELLBUTRIN XL) 150 MG 24 hr tablet Take 1 tablet (150 mg total) by mouth daily. Take with 300mg  dose to equal 450mg  daily (Patient not taking: Reported on 04/25/2022)   buPROPion (WELLBUTRIN XL) 300 MG 24 hr tablet Take 1 tablet (300 mg total) by mouth daily.   cloNIDine (CATAPRES) 0.1 MG tablet Take 1 tablet (0.1 mg total) by mouth at bedtime.   Clotrimazole 1 % OINT Apply 1 application. topically daily. (Patient not taking: Reported on 04/11/2022)   DULoxetine (CYMBALTA) 30 MG capsule Take 1  capsule (30 mg total) by mouth daily.   lamoTRIgine (LAMICTAL) 100 MG tablet Take one tab daily   meloxicam (MOBIC) 15 MG tablet Take 1 tablet (15 mg total) by mouth daily as needed.   metoprolol succinate (TOPROL-XL) 100 MG 24 hr tablet Take 1 tablet by mouth daily. Take with or immediately following a meal.   Multiple Vitamin (MULTIVITAMIN ADULT PO) Take by mouth.   Omega-3 Fatty Acids (FISH OIL OMEGA-3 PO) Take  by mouth.   omeprazole (PRILOSEC) 20 MG capsule Take 1 capsule (20 mg total) by mouth daily.   tadalafil (CIALIS) 20 MG tablet Take 0.5-1 tablets (10-20 mg total) by mouth every other day as needed for erectile dysfunction.   tamsulosin (FLOMAX) 0.4 MG CAPS capsule Take 1 capsule (0.4 mg total) by mouth daily.   traZODone (DESYREL) 100 MG tablet Take 1 tablet (100 mg total) by mouth at bedtime.   valsartan (DIOVAN) 160 MG tablet Take 1 tablet (160 mg total) by mouth daily.   No facility-administered encounter medications on file as of 06/29/2022.    Recent Results (from the past 2160 hour(s))  Hemoglobin A1c     Status: None   Collection Time: 04/11/22 12:00 AM  Result Value Ref Range   Hemoglobin A1C 5.7      Psychiatric Specialty Exam: Physical Exam  Review of Systems  Psychiatric/Behavioral:  Positive for decreased concentration and dysphoric mood.     Weight (!) 354 lb (160.6 kg).There is no height or weight on file to calculate BMI.  General Appearance: Fairly Groomed, beard  Eye Contact:  Fair  Speech:  Slow  Volume:  Decreased  Mood:  Dysphoric  Affect:  Congruent  Thought Process:  Goal Directed  Orientation:  Full (Time, Place, and Person)  Thought Content:  Rumination  Suicidal Thoughts:  No  Homicidal Thoughts:  No  Memory:  Immediate;   Fair Recent;   Fair Remote;   Good  Judgement:  Intact  Insight:  Present  Psychomotor Activity:  Decreased  Concentration:  Concentration: Fair and Attention Span: Fair  Recall:  [employer] of Knowledge:  Fair  Language:  Fair  Akathisia:  No  Handed:  Right  AIMS (if indicated):     Assets:  Communication Skills Desire for Improvement Social Support Transportation  ADL's:  Intact  Cognition:  WNL  Sleep:  better since using more CPAP     Assessment/Plan: Mood disorder (HCC) - Plan: buPROPion (WELLBUTRIN XL) 300 MG 24 hr tablet, lamoTRIgine (LAMICTAL) 150 MG tablet  Anxiety - Plan: buPROPion (WELLBUTRIN XL) 300  MG 24 hr tablet, lamoTRIgine (LAMICTAL) 150 MG tablet  Attention deficit hyperactivity disorder (ADHD), combined type - Plan: buPROPion (WELLBUTRIN XL) 300 MG 24 hr tablet  Patient doing better Lamictal.  He has no major concern.  Recommend to discontinue Cymbalta as we discussed the plan before.  His mood symptoms are better and he is not as agitated and irritable.  Continue Wellbutrin XL 300 mg daily.  We discussed to optimize further Lamictal to help his mood lability and he agreed with the plan.  We will try Lamictal 150 mg daily.  I recommended to closely monitor if he has any rash, itching.  He is taking trazodone, clonidine and Xanax from primary care.  Encouraged to keep appointment today for ADHD testing.  Recommend to call us back if there is any question, concern or if he feels worsening of the symptoms.  Follow-up in 2 months.   Follow Up  Instructions:     I discussed the assessment and treatment plan with the patient. The patient was provided an opportunity to ask questions and all were answered. The patient agreed with the plan and demonstrated an understanding of the instructions.   The patient was advised to call back or seek an in-person evaluation if the symptoms worsen or if the condition fails to improve as anticipated.    Collaboration of Care: Other provider involved in patient's care AEB notes are available in epic to review.  Patient/Guardian was advised Release of Information must be obtained prior to any record release in order to collaborate their care with an outside provider. Patient/Guardian was advised if they have not already done so to contact the registration department to sign all necessary forms in order for [place] to release information regarding their care.   Consent: Patient/Guardian gives verbal consent for treatment and assignment of benefits for services provided during this visit. Patient/Guardian expressed understanding and agreed to proceed.     I  provided 32 minutes of non face to face time during this encounter.  Note: This document was prepared by [employer] voice dictation technology and any errors that results from this process are unintentional.    Cleotis Nipper, MD 06/29/2022

## 2022-06-30 DIAGNOSIS — Z419 Encounter for procedure for purposes other than remedying health state, unspecified: Secondary | ICD-10-CM | POA: Diagnosis not present

## 2022-07-06 ENCOUNTER — Telehealth (HOSPITAL_COMMUNITY): Payer: Self-pay | Admitting: *Deleted

## 2022-07-06 NOTE — Telephone Encounter (Signed)
He may have withdrawals from Cymbalta which was discontinued.  He can resume Cymbalta 30 mg for now until he had court date.  He had ADHD testing recently do we have the results?  If he is having any suicidal thoughts or homicidal thoughts then he need to go to the emergency room.

## 2022-07-06 NOTE — Telephone Encounter (Signed)
No results yet. It usually takes a few days to week to get results. Will advise to resume Cymbalta. Pt verbalizes understanding. Goes to court 07/11/22.

## 2022-07-06 NOTE — Telephone Encounter (Signed)
Writer spoke with pt after wife LVM stating that pt was having "concerning" s/e (paranoia, AVH) from med changes made yesterday (Cymbalta d/c, Lamictal increased).  Writer then called and spoke with pt who says that he sees his daughters dead in various ways but says it's more like a "scene from a movie" or a daydream rather than AV. Pt also describes feeling scared all the time, worrying or feeling that someone is going to  hurt either himself or his family. Pt also describes seeing himself in prison and being "almost dead". He does acknowledge this may be r/t his upcoming court case. Pt did acknowledge that he has been feeling this way since 2/24 but didn't really want tosay anything. Pt also endorses nightmares. Pt does deny SI or HI. [facility] discussed but pt would like to hear from you, or your advice, first. Please review and advise.

## 2022-07-16 ENCOUNTER — Encounter: Payer: Self-pay | Admitting: Nurse Practitioner

## 2022-07-16 ENCOUNTER — Ambulatory Visit (INDEPENDENT_AMBULATORY_CARE_PROVIDER_SITE_OTHER): Payer: Medicaid Other | Admitting: Nurse Practitioner

## 2022-07-16 VITALS — BP 116/80 | HR 88 | Temp 97.1°F | Ht 72.0 in | Wt 375.0 lb

## 2022-07-16 DIAGNOSIS — I10 Essential (primary) hypertension: Secondary | ICD-10-CM | POA: Diagnosis not present

## 2022-07-16 DIAGNOSIS — F321 Major depressive disorder, single episode, moderate: Secondary | ICD-10-CM

## 2022-07-16 DIAGNOSIS — F419 Anxiety disorder, unspecified: Secondary | ICD-10-CM

## 2022-07-16 DIAGNOSIS — Z6841 Body Mass Index (BMI) 40.0 and over, adult: Secondary | ICD-10-CM | POA: Diagnosis not present

## 2022-07-16 DIAGNOSIS — R7303 Prediabetes: Secondary | ICD-10-CM

## 2022-07-16 LAB — LIPID PANEL
Cholesterol: 105 mg/dL (ref 0–200)
HDL: 40.4 mg/dL
LDL Cholesterol: 45 mg/dL (ref 0–99)
NonHDL: 64.22
Total CHOL/HDL Ratio: 3
Triglycerides: 98 mg/dL (ref 0.0–149.0)
VLDL: 19.6 mg/dL (ref 0.0–40.0)

## 2022-07-16 LAB — COMPREHENSIVE METABOLIC PANEL
ALT: 53 U/L (ref 0–53)
AST: 49 U/L — ABNORMAL HIGH (ref 0–37)
Albumin: 4 g/dL (ref 3.5–5.2)
Alkaline Phosphatase: 38 U/L — ABNORMAL LOW (ref 39–117)
BUN: 13 mg/dL (ref 6–23)
CO2: 30 mEq/L (ref 19–32)
Calcium: 9.8 mg/dL (ref 8.4–10.5)
Chloride: 103 mEq/L (ref 96–112)
Creatinine, Ser: 0.89 mg/dL (ref 0.40–1.50)
GFR: 105.56 mL/min (ref >60.00)
Glucose, Bld: 117 mg/dL — ABNORMAL HIGH (ref 70–99)
Potassium: 4.8 mEq/L (ref 3.5–5.1)
Sodium: 140 mEq/L (ref 135–145)
Total Bilirubin: 0.4 mg/dL (ref 0.2–1.2)
Total Protein: 7.2 g/dL (ref 6.0–8.3)

## 2022-07-16 LAB — CBC
HCT: 42.2 % (ref 39.0–52.0)
Hemoglobin: 13.5 g/dL (ref 13.0–17.0)
MCHC: 31.9 g/dL (ref 30.0–36.0)
MCV: 80.4 fl (ref 78.0–100.0)
Platelets: 202 10*3/uL (ref 150.0–400.0)
RBC: 5.25 Mil/uL (ref 4.22–5.81)
RDW: 14.9 % (ref 11.5–15.5)
WBC: 8.6 10*3/uL (ref 4.0–10.5)

## 2022-07-16 LAB — HEMOGLOBIN A1C: Hgb A1c MFr Bld: 6 % (ref 4.6–6.5)

## 2022-07-16 NOTE — Assessment & Plan Note (Signed)
Chronic, stable.  Continue Toprol-XL 100 mg daily and valsartan 160 mg daily.  Check CMP, CBC, lipid panel today.  Follow-up in 3 months.

## 2022-07-16 NOTE — Assessment & Plan Note (Signed)
Chronic, stable.  He feels like his sugars may be elevated but has not been checking them.  Will check A1c today.

## 2022-07-16 NOTE — Assessment & Plan Note (Signed)
Chronic, ongoing.  He is currently following with psychiatry who is adjusting his medications.  Continue collaboration recommendations from psychiatry.

## 2022-07-16 NOTE — Assessment & Plan Note (Signed)
Chronic, ongoing.  He is currently following with psychiatry who is adjusting his medications.  Continue collaboration recommendations from psychiatry. 

## 2022-07-16 NOTE — Assessment & Plan Note (Signed)
He has gained 19 pounds since his last visit.  Discussed nutrition, exercise.

## 2022-07-16 NOTE — Patient Instructions (Signed)
It was great to see you!  We are checking your labs today and will let you know the results via mychart/phone.   Let's follow-up in 3 months, sooner if you have concerns.  If a referral was placed today, you will be contacted for an appointment. Please note that routine referrals can sometimes take up to 3-4 weeks to process. Please call our office if you haven't heard anything after this time frame.  Take care,  Robert Sunga, NP  

## 2022-07-16 NOTE — Progress Notes (Signed)
Established Patient Office Visit  Subjective   Patient ID: Peter Gutierrez, male    DOB: 1980-03-11  Age: 42 y.o. MRN: [id number]  Chief Complaint  Patient presents with   Hypertension    Follow up, no concerns    Hypertension    Peter Gutierrez is here to follow-up on hypertension, depression, and anxiety.   He is currently following with psychiatry.  He states that they have been making adjustments on his medications.  He is still having trouble sleeping at night and is having nightmares.  He denies SI/HI.  He is currently taking Wellbutrin XL 300 mg daily, duloxetine 30 mg daily, Lamictal 150 mg daily, trazodone 100 mg at bedtime, and clonidine 0.1 mg at bedtime.  He also will take Xanax 0.5 mg twice a day as needed.  HYPERTENSION  Hypertension status: controlled  Satisfied with current treatment? yes Duration of hypertension: chronic BP monitoring frequency:  daily BP range: 110s-130s/70s BP medication side effects:  no Medication compliance: excellent compliance Previous BP meds: toprol xl, valsartan Aspirin: no Recurrent headaches: no Visual changes: no Palpitations: no Dyspnea: no Chest pain: no Lower extremity edema: no Dizzy/lightheaded: no     07/16/2022    9:56 AM 04/11/2022    8:35 AM 03/08/2022    2:17 PM 01/17/2022    8:34 AM 01/17/2022    8:12 AM  Depression screen PHQ 2/9  Decreased Interest 1 2 3 1  0  Down, Depressed, Hopeless 1 2 3 1 1   PHQ - 2 Score 2 4 6 2 1   Altered sleeping 3 3 3 1    Tired, decreased energy 3 1 3 1    Change in appetite 3 3 3 1    Feeling bad or failure about yourself  2 2 3 1    Trouble concentrating 3 3 3  0   Moving slowly or fidgety/restless 2 2 3 1    Suicidal thoughts 0 0 0 0   PHQ-9 Score 18 18 24 7    Difficult doing work/chores Very difficult Very difficult Very difficult Somewhat difficult Not difficult at all      07/16/2022    9:56 AM 04/11/2022    8:36 AM 03/08/2022    2:18 PM 01/17/2022    8:34 AM  GAD 7 :  Generalized Anxiety Score  Nervous, Anxious, on Edge 2 3 3 1   Control/stop worrying 2 3 3 1   Worry too much - different things 2 3 3 1   Trouble relaxing 2 2 3 1   Restless 2 3 3 1   Easily annoyed or irritable 1 1 0 2  Afraid - awful might happen 3 3 3 1   Total GAD 7 Score 14 18 18 8   Anxiety Difficulty Very difficult Very difficult Not difficult at all Somewhat difficult      ROS See pertinent positives and negatives per HPI.    Objective:     BP 116/80 (BP Location: Right Arm) Comment: Manually taken  Pulse 88   Temp (!) 97.1 F (36.2 C)   Ht 6' (1.829 m)   Wt (!) 375 lb (170.1 kg)   SpO2 94%   BMI 50.86 kg/m  BP Readings from Last 3 Encounters:  07/16/22 116/80  04/11/22 130/86  01/17/22 126/86   Wt Readings from Last 3 Encounters:  07/16/22 (!) 375 lb (170.1 kg)  04/11/22 (!) 356 lb (161.5 kg)  03/08/22 (!) 352 lb (159.7 kg)    Physical Exam Vitals and nursing note reviewed.  Constitutional:  Appearance: Normal appearance. He is obese.  HENT:     Head: Normocephalic.  Eyes:     Conjunctiva/sclera: Conjunctivae normal.  Cardiovascular:     Rate and Rhythm: Normal rate and regular rhythm.     Pulses: Normal pulses.     Heart sounds: Normal heart sounds.  Pulmonary:     Effort: Pulmonary effort is normal.     Breath sounds: Normal breath sounds.  Musculoskeletal:     Cervical back: Normal range of motion.  Skin:    General: Skin is warm.  Neurological:     General: No focal deficit present.     Mental Status: He is alert and oriented to person, place, and time.  Psychiatric:        Mood and Affect: Mood normal.        Behavior: Behavior normal.        Thought Content: Thought content normal.        Judgment: Judgment normal.      Assessment & Plan:   Problem List Items Addressed This Visit       Cardiovascular and Mediastinum   Hypertension - Primary    Chronic, stable.  Continue Toprol-XL 100 mg daily and valsartan 160 mg daily.  Check  CMP, CBC, lipid panel today.  Follow-up in 3 months.      Relevant Orders   CBC   Lipid panel   Comprehensive metabolic panel     Other   Current moderate episode of major depressive disorder without prior episode (HCC)    Chronic, ongoing.  He is currently following with psychiatry who is adjusting his medications.  Continue collaboration recommendations from psychiatry.      Anxiety    Chronic, ongoing.  He is currently following with psychiatry who is adjusting his medications.  Continue collaboration recommendations from psychiatry.      Prediabetes    Chronic, stable.  He feels like his sugars may be elevated but has not been checking them.  Will check A1c today.      Relevant Orders   Hemoglobin A1c   Morbid obesity (HCC)    He has gained 19 pounds since his last visit.  Discussed nutrition, exercise.       Return in about 3 months (around 10/16/2022) for HTN, prediabetes.    Gerre Scull, NP

## 2022-07-17 ENCOUNTER — Encounter: Payer: Self-pay | Admitting: Nurse Practitioner

## 2022-07-18 MED ORDER — METFORMIN HCL ER 500 MG PO TB24: 500.0000 mg | ORAL_TABLET | Freq: Every day | ORAL | 2 refills | Status: DC

## 2022-07-30 DIAGNOSIS — Z419 Encounter for procedure for purposes other than remedying health state, unspecified: Secondary | ICD-10-CM | POA: Diagnosis not present

## 2022-08-03 ENCOUNTER — Other Ambulatory Visit: Payer: Self-pay | Admitting: Nurse Practitioner

## 2022-08-06 ENCOUNTER — Other Ambulatory Visit (HOSPITAL_COMMUNITY): Payer: Self-pay

## 2022-08-06 DIAGNOSIS — F419 Anxiety disorder, unspecified: Secondary | ICD-10-CM

## 2022-08-06 DIAGNOSIS — F39 Unspecified mood [affective] disorder: Secondary | ICD-10-CM

## 2022-08-06 MED ORDER — DULOXETINE HCL 30 MG PO CPEP
30.0000 mg | ORAL_CAPSULE | Freq: Every day | ORAL | 0 refills | Status: DC
Start: 2022-08-06 — End: 2022-08-21

## 2022-08-06 MED ORDER — LAMOTRIGINE 150 MG PO TABS
ORAL_TABLET | ORAL | 0 refills | Status: DC
Start: 2022-08-06 — End: 2023-10-22

## 2022-08-21 ENCOUNTER — Other Ambulatory Visit: Payer: Self-pay | Admitting: Nurse Practitioner

## 2022-08-21 DIAGNOSIS — F39 Unspecified mood [affective] disorder: Secondary | ICD-10-CM

## 2022-08-29 ENCOUNTER — Encounter (HOSPITAL_COMMUNITY): Payer: Self-pay

## 2022-08-29 ENCOUNTER — Telehealth (HOSPITAL_BASED_OUTPATIENT_CLINIC_OR_DEPARTMENT_OTHER): Payer: Self-pay | Admitting: Psychiatry

## 2022-08-29 DIAGNOSIS — Z91199 Patient's noncompliance with other medical treatment and regimen due to unspecified reason: Secondary | ICD-10-CM

## 2022-08-29 NOTE — Progress Notes (Signed)
Patient not on a video.  When made a telephone call he told that he overslept and like to reschedule an appointment.

## 2022-08-30 DIAGNOSIS — Z419 Encounter for procedure for purposes other than remedying health state, unspecified: Secondary | ICD-10-CM | POA: Diagnosis not present

## 2022-09-03 ENCOUNTER — Other Ambulatory Visit: Payer: Self-pay | Admitting: Nurse Practitioner

## 2022-09-04 NOTE — Telephone Encounter (Signed)
Requesting: omeprazole 20 mg capsule,delayed release, valsartan 160 mg tablet  Last Visit: 07/16/2022 Next Visit: 10/16/2022 Last Refill: 04/24/2022, 03/06/2022  Please Advise

## 2022-09-13 ENCOUNTER — Encounter (INDEPENDENT_AMBULATORY_CARE_PROVIDER_SITE_OTHER): Payer: Self-pay

## 2022-09-19 ENCOUNTER — Other Ambulatory Visit: Payer: Self-pay | Admitting: Nurse Practitioner

## 2022-09-26 ENCOUNTER — Telehealth (HOSPITAL_COMMUNITY): Payer: Medicaid Other | Admitting: Psychiatry

## 2022-09-26 ENCOUNTER — Encounter (HOSPITAL_COMMUNITY): Payer: Self-pay

## 2022-09-26 NOTE — Progress Notes (Signed)
Patient is late/no-show for his appointment.  This is the second time he is no-show.

## 2022-09-30 DIAGNOSIS — Z419 Encounter for procedure for purposes other than remedying health state, unspecified: Secondary | ICD-10-CM | POA: Diagnosis not present

## 2022-10-04 ENCOUNTER — Other Ambulatory Visit: Payer: Self-pay | Admitting: Nurse Practitioner

## 2022-10-15 NOTE — Progress Notes (Unsigned)
Established Patient Office Visit  Subjective   Patient ID: Peter Gutierrez, male    DOB: Jan 11, 1981  Age: 42 y.o. MRN: [id number]  No chief complaint on file.   HPI  LAFAYETTE MEECH is here to follow-up on hypertension and prediabetes.   Last visit, he was started on Metformin XR 500mg  daily.   HYPERTENSION  Hypertension status: {Blank single:19197::"controlled","uncontrolled","better","worse","exacerbated","stable"}  Satisfied with current treatment? {Blank single:19197::"yes","no"} Duration of hypertension: {Blank single:19197::"chronic","months","years"} BP monitoring frequency:  {Blank single:19197::"not checking","rarely","daily","weekly","monthly","a few times a day","a few times a week","a few times a month"} BP range:  BP medication side effects:  {Blank single:19197::"yes","no"} Medication compliance: {Blank single:19197::"excellent compliance","good compliance","fair compliance","poor compliance"} Previous BP meds:Toprol XL, valsartan Aspirin: {Blank single:19197::"yes","no"} Recurrent headaches: {Blank single:19197::"yes","no"} Visual changes: {Blank single:19197::"yes","no"} Palpitations: {Blank single:19197::"yes","no"} Dyspnea: {Blank single:19197::"yes","no"} Chest pain: {Blank single:19197::"yes","no"} Lower extremity edema: {Blank single:19197::"yes","no"} Dizzy/lightheaded: {Blank single:19197::"yes","no"}   {History (Optional):23778}  ROS    Objective:     There were no vitals taken for this visit. BP Readings from Last 3 Encounters:  07/16/22 116/80  04/11/22 130/86  01/17/22 126/86   Wt Readings from Last 3 Encounters:  07/16/22 (!) 375 lb (170.1 kg)  04/11/22 (!) 356 lb (161.5 kg)  03/08/22 (!) 352 lb (159.7 kg)      Physical Exam   No results found for any visits on 10/16/22.  {Labs (Optional):23779}  The ASCVD Risk score (Arnett DK, et al., 2019) failed to calculate for the following reasons:   The valid total cholesterol range  is 130 to 320 mg/dL    Assessment & Plan:   Problem List Items Addressed This Visit   None   No follow-ups on file.    Gerre Scull, NP

## 2022-10-16 ENCOUNTER — Encounter: Payer: Self-pay | Admitting: Nurse Practitioner

## 2022-10-16 ENCOUNTER — Ambulatory Visit (INDEPENDENT_AMBULATORY_CARE_PROVIDER_SITE_OTHER): Payer: Medicaid Other | Admitting: Nurse Practitioner

## 2022-10-16 VITALS — BP 118/82 | HR 82 | Temp 97.9°F | Ht 72.0 in | Wt 398.0 lb

## 2022-10-16 DIAGNOSIS — H6690 Otitis media, unspecified, unspecified ear: Secondary | ICD-10-CM | POA: Diagnosis not present

## 2022-10-16 DIAGNOSIS — R7303 Prediabetes: Secondary | ICD-10-CM | POA: Diagnosis not present

## 2022-10-16 DIAGNOSIS — I10 Essential (primary) hypertension: Secondary | ICD-10-CM

## 2022-10-16 DIAGNOSIS — F321 Major depressive disorder, single episode, moderate: Secondary | ICD-10-CM

## 2022-10-16 DIAGNOSIS — F419 Anxiety disorder, unspecified: Secondary | ICD-10-CM

## 2022-10-16 MED ORDER — CEFDINIR 300 MG PO CAPS: 300.0000 mg | ORAL_CAPSULE | Freq: Two times a day (BID) | ORAL | 0 refills | Status: DC

## 2022-10-16 MED ORDER — WEGOVY 1.7 MG/0.75ML ~~LOC~~ SOAJ: 1.7000 mg | SUBCUTANEOUS | 1 refills | Status: DC

## 2022-10-16 NOTE — Assessment & Plan Note (Signed)
Chronic, stable.  He states that the metformin he did not notice any difference in appetite or cravings.  Will have him stop this medication.  Starting him on Wegovy as noted below for weight management.

## 2022-10-16 NOTE — Patient Instructions (Signed)
It was great to see you!  Stop the metformin  Start wegovy 1.7mg  injection weekly.   Start cefdinir twice a day for 7 days for your ear.   Let's follow-up in 3 months, sooner if you have concerns.  If a referral was placed today, you will be contacted for an appointment. Please note that routine referrals can sometimes take up to 3-4 weeks to process. Please call our office if you haven't heard anything after this time frame.  Take care,  Rodman Pickle, NP

## 2022-10-16 NOTE — Assessment & Plan Note (Signed)
Chronic, not controlled.  He denies SI/HI.  He states that he has an appointment with a new psychiatrist tomorrow as his current regimen is not working for him.  He is still having a lot of anxiety and trouble sleeping.  Encouraged him to keep the appointment tomorrow.

## 2022-10-16 NOTE — Assessment & Plan Note (Addendum)
BMI 53.9.  He has gained 20 pounds since his last visit.  He has been taking Ozempic 1 mg every other week, but has not noticed any improvement in his weight or appetite with this.  Will have him start Wegovy 1.7 mg injection weekly.  Discussed possible side effects.  Continue focus on nutrition and exercise.  Follow-up in 3 months.

## 2022-10-16 NOTE — Assessment & Plan Note (Addendum)
Chronic, stable.  Continue Toprol-XL 100 mg daily and valsartan 160 mg daily.  Follow-up in 3 months.

## 2022-10-17 ENCOUNTER — Other Ambulatory Visit (HOSPITAL_COMMUNITY): Payer: Self-pay

## 2022-10-17 ENCOUNTER — Telehealth: Payer: Self-pay

## 2022-10-17 DIAGNOSIS — F411 Generalized anxiety disorder: Secondary | ICD-10-CM | POA: Diagnosis not present

## 2022-10-17 DIAGNOSIS — F331 Major depressive disorder, recurrent, moderate: Secondary | ICD-10-CM | POA: Diagnosis not present

## 2022-10-17 NOTE — Telephone Encounter (Signed)
Pharmacy Patient Advocate Encounter   Received notification from Physician's Office that prior authorization for [facility] 1.7MG   is required/requested.   Insurance verification completed.   The patient is insured through [facility] [place] .   Per test claim: PA required; PA submitted to [facility] Medicaid via CoverMyMeds Key/confirmation #/EOC (Key: [id number]   Status is pending

## 2022-10-22 ENCOUNTER — Other Ambulatory Visit (HOSPITAL_COMMUNITY): Payer: Self-pay

## 2022-10-22 NOTE — Telephone Encounter (Signed)
Pharmacy Patient Advocate Encounter  Received notification from [facility] Medicaid that Prior Authorization for [facility] has been APPROVED from 9.18.24 to 3.17.25. Ran test claim, Copay is $4.00. This test claim was processed through [facility]- copay amounts may vary at other pharmacies due to pharmacy/plan contracts, or as the patient moves through the different stages of their insurance plan.   PA #/Case ID/Reference #: [id number]

## 2022-10-22 NOTE — Telephone Encounter (Signed)
Lvm for patient to return call.

## 2022-10-22 NOTE — Telephone Encounter (Signed)
Patient returned call and I told patient that Rx of [facility] approved.

## 2022-10-30 DIAGNOSIS — Z419 Encounter for procedure for purposes other than remedying health state, unspecified: Secondary | ICD-10-CM | POA: Diagnosis not present

## 2022-10-31 DIAGNOSIS — F331 Major depressive disorder, recurrent, moderate: Secondary | ICD-10-CM | POA: Diagnosis not present

## 2022-10-31 DIAGNOSIS — F411 Generalized anxiety disorder: Secondary | ICD-10-CM | POA: Diagnosis not present

## 2022-11-02 ENCOUNTER — Other Ambulatory Visit: Payer: Self-pay | Admitting: Nurse Practitioner

## 2022-11-07 ENCOUNTER — Telehealth (HOSPITAL_COMMUNITY): Payer: Medicaid Other | Admitting: Psychiatry

## 2022-11-08 ENCOUNTER — Ambulatory Visit (INDEPENDENT_AMBULATORY_CARE_PROVIDER_SITE_OTHER): Payer: Medicaid Other | Admitting: Nurse Practitioner

## 2022-11-08 ENCOUNTER — Encounter: Payer: Self-pay | Admitting: Nurse Practitioner

## 2022-11-08 VITALS — BP 118/82 | HR 83 | Temp 97.1°F | Ht 72.0 in | Wt >= 6400 oz

## 2022-11-08 DIAGNOSIS — F419 Anxiety disorder, unspecified: Secondary | ICD-10-CM

## 2022-11-08 DIAGNOSIS — R7303 Prediabetes: Secondary | ICD-10-CM

## 2022-11-08 DIAGNOSIS — R600 Localized edema: Secondary | ICD-10-CM | POA: Diagnosis not present

## 2022-11-08 DIAGNOSIS — M79602 Pain in left arm: Secondary | ICD-10-CM | POA: Diagnosis not present

## 2022-11-08 DIAGNOSIS — I10 Essential (primary) hypertension: Secondary | ICD-10-CM

## 2022-11-08 LAB — CBC WITH DIFFERENTIAL/PLATELET
Basophils Absolute: 0 10*3/uL (ref 0.0–0.1)
Basophils Relative: 0.5 % (ref 0.0–3.0)
Eosinophils Absolute: 0.5 10*3/uL (ref 0.0–0.7)
Eosinophils Relative: 6.8 % — ABNORMAL HIGH (ref 0.0–5.0)
HCT: 41 % (ref 39.0–52.0)
Hemoglobin: 12.8 g/dL — ABNORMAL LOW (ref 13.0–17.0)
Lymphocytes Relative: 26.1 % (ref 12.0–46.0)
Lymphs Abs: 2 10*3/uL (ref 0.7–4.0)
MCHC: 31.2 g/dL (ref 30.0–36.0)
MCV: 80.2 fL (ref 78.0–100.0)
Monocytes Absolute: 1.1 10*3/uL — ABNORMAL HIGH (ref 0.1–1.0)
Monocytes Relative: 14 % — ABNORMAL HIGH (ref 3.0–12.0)
Neutro Abs: 4 10*3/uL (ref 1.4–7.7)
Neutrophils Relative %: 52.6 % (ref 43.0–77.0)
Platelets: 209 10*3/uL (ref 150.0–400.0)
RBC: 5.11 Mil/uL (ref 4.22–5.81)
RDW: 15.8 % — ABNORMAL HIGH (ref 11.5–15.5)
WBC: 7.6 10*3/uL (ref 4.0–10.5)

## 2022-11-08 LAB — COMPREHENSIVE METABOLIC PANEL
ALT: 64 U/L — ABNORMAL HIGH (ref 0–53)
AST: 85 U/L — ABNORMAL HIGH (ref 0–37)
Albumin: 3.7 g/dL (ref 3.5–5.2)
Alkaline Phosphatase: 46 U/L (ref 39–117)
BUN: 12 mg/dL (ref 6–23)
CO2: 33 meq/L — ABNORMAL HIGH (ref 19–32)
Calcium: 9.8 mg/dL (ref 8.4–10.5)
Chloride: 104 meq/L (ref 96–112)
Creatinine, Ser: 0.83 mg/dL (ref 0.40–1.50)
GFR: 107.57 mL/min (ref >60.00)
Glucose, Bld: 78 mg/dL (ref 70–99)
Potassium: 4.2 meq/L (ref 3.5–5.1)
Sodium: 143 meq/L (ref 135–145)
Total Bilirubin: 0.4 mg/dL (ref 0.2–1.2)
Total Protein: 5.9 g/dL — ABNORMAL LOW (ref 6.0–8.3)

## 2022-11-08 LAB — TSH: TSH: 1.9 u[IU]/mL (ref 0.35–5.50)

## 2022-11-08 LAB — HEMOGLOBIN A1C: Hgb A1c MFr Bld: 6.9 % — ABNORMAL HIGH (ref 4.6–6.5)

## 2022-11-08 LAB — SEDIMENTATION RATE: Sed Rate: 39 mm/h — ABNORMAL HIGH (ref 0–15)

## 2022-11-08 MED ORDER — VALSARTAN-HYDROCHLOROTHIAZIDE 160-25 MG PO TABS: 1.0000 | ORAL_TABLET | Freq: Every day | ORAL | 0 refills | Status: DC

## 2022-11-08 NOTE — Assessment & Plan Note (Signed)
Chronic, stable. Will add on hydrochlorothiazide to help with swelling in addition to valsartan 160mg  daily and Toprol XL 100mg  daily. Follow-up in 2 months

## 2022-11-08 NOTE — Patient Instructions (Signed)
It was great to see you!  We are checking your labs today and will let you know the results via mychart/phone.   Keep using the voltaren gel and icy hot.   Start valsartan-hydrochlorothiazide once a day.   Let's follow-up at your next scheduled visit, sooner if you have concerns.  If a referral was placed today, you will be contacted for an appointment. Please note that routine referrals can sometimes take up to 3-4 weeks to process. Please call our office if you haven't heard anything after this time frame.  Take care,  Rodman Pickle, NP

## 2022-11-08 NOTE — Assessment & Plan Note (Signed)
Chronic, stable. Check A1c today and treat based on results.

## 2022-11-08 NOTE — Progress Notes (Signed)
Acute Office Visit  Subjective:     Patient ID: Peter Gutierrez, male    DOB: 04/09/1980, 42 y.o.   MRN: [id number]  Chief Complaint  Patient presents with   Leg Swelling    Right leg swelling and pain in left arm for 2 weeks    HPI Patient is in today for right leg swelling and left arm pain for 2 weeks.  He states that he has been having left arm pain for the last 2 weeks. He states that it was the back of his hand and entire left forearm, however now it's just the back of his hand and posterior forearm. He denies injury and swelling. He has been using tylenol, icy hot, and voltaren gel. He states the pain is worse when he lifts something up and will sometimes start shaking. He states that he did start latuda a few weeks ago and is not sure if this is a side effect.   He has also been experiencing right leg swelling and left ankle swelling. He states that it is not painful, red, or warm. The swelling tends to be worse in the evenings. He has been wearing compression socks which help until he takes them off. He denies chest pain and shortness of breath.   ROS See pertinent positives and negatives per HPI.     Objective:    BP 118/82 (BP Location: Right Wrist)   Pulse 83   Temp (!) 97.1 F (36.2 C)   Ht 6' (1.829 m)   Wt (!) 406 lb 12.8 oz (184.5 kg)   SpO2 97%   BMI 55.17 kg/m  BP Readings from Last 3 Encounters:  11/08/22 118/82  10/16/22 118/82  07/16/22 116/80   Wt Readings from Last 3 Encounters:  11/08/22 (!) 406 lb 12.8 oz (184.5 kg)  10/16/22 (!) 398 lb (180.5 kg)  07/16/22 (!) 375 lb (170.1 kg)      Physical Exam Vitals and nursing note reviewed.  Constitutional:      Appearance: Normal appearance.  HENT:     Head: Normocephalic.  Eyes:     Conjunctiva/sclera: Conjunctivae normal.  Cardiovascular:     Rate and Rhythm: Normal rate and regular rhythm.     Pulses: Normal pulses.     Heart sounds: Normal heart sounds.  Pulmonary:     Effort:  Pulmonary effort is normal.     Breath sounds: Normal breath sounds.  Musculoskeletal:        General: No tenderness. Normal range of motion.     Cervical back: Normal range of motion.     Right lower leg: Edema (trace ankle) present.     Left lower leg: Edema (trace ankle) present.     Comments: Negative empty can test  Skin:    General: Skin is warm.     Findings: No erythema.  Neurological:     General: No focal deficit present.     Mental Status: He is alert and oriented to person, place, and time.  Psychiatric:        Mood and Affect: Mood normal.        Behavior: Behavior normal.        Thought Content: Thought content normal.        Judgment: Judgment normal.      Assessment & Plan:   Problem List Items Addressed This Visit       Cardiovascular and Mediastinum   Hypertension    Chronic, stable. Will add on hydrochlorothiazide  to help with swelling in addition to valsartan 160mg  daily and Toprol XL 100mg  daily. Follow-up in 2 months      Relevant Medications   valsartan-hydrochlorothiazide (DIOVAN-HCT) 160-25 MG tablet   Other Relevant Orders   CBC with Differential/Platelet   Comprehensive metabolic panel     Other   Anxiety    Chronic, ongoing. He is still following with psychiatry and recently started latuda in addition to Wellbturin XL 300mg  daily, cymbalta 30mg  daily, and lamictal 150mg  daily.  Will check TSH today.       Relevant Orders   TSH   Prediabetes    Chronic, stable. Check A1c today and treat based on results.       Relevant Orders   Hemoglobin A1c   Bilateral leg edema    He has been having some right lower leg swelling and left ankle swelling. Trace bilateral ankle swelling noted on exam. Start hydrochlorothiazide 25mg  daily. Continue wearing compression socks and limiting salt in diet. Follow-up in 2 months or sooner with concerns.       Other Visit Diagnoses     Left arm pain    -  Primary   Left arm pain x2 weeks. Exam normal, no  weakness. Will check ESR today. Continue voltaren gel, and can use ice/heat. F/U if not improving.   Relevant Orders   Sedimentation rate       Meds ordered this encounter  Medications   valsartan-hydrochlorothiazide (DIOVAN-HCT) 160-25 MG tablet    Sig: Take 1 tablet by mouth daily.    Dispense:  90 tablet    Refill:  0    Return if symptoms worsen or fail to improve.  Gerre Scull, NP

## 2022-11-08 NOTE — Assessment & Plan Note (Signed)
He has been having some right lower leg swelling and left ankle swelling. Trace bilateral ankle swelling noted on exam. Start hydrochlorothiazide 25mg  daily. Continue wearing compression socks and limiting salt in diet. Follow-up in 2 months or sooner with concerns.

## 2022-11-08 NOTE — Assessment & Plan Note (Signed)
Chronic, ongoing. He is still following with psychiatry and recently started latuda in addition to Wellbturin XL 300mg  daily, cymbalta 30mg  daily, and lamictal 150mg  daily.  Will check TSH today.

## 2022-11-09 MED ORDER — PREDNISONE 20 MG PO TABS: 40.0000 mg | ORAL_TABLET | Freq: Every day | ORAL | 0 refills | Status: DC

## 2022-11-09 NOTE — Addendum Note (Signed)
Addended by: Rodman Pickle A on: 11/09/2022 12:34 PM   Modules accepted: Orders

## 2022-11-15 ENCOUNTER — Other Ambulatory Visit: Payer: Self-pay | Admitting: Nurse Practitioner

## 2022-11-21 DIAGNOSIS — F411 Generalized anxiety disorder: Secondary | ICD-10-CM | POA: Diagnosis not present

## 2022-11-21 DIAGNOSIS — F331 Major depressive disorder, recurrent, moderate: Secondary | ICD-10-CM | POA: Diagnosis not present

## 2022-11-30 DIAGNOSIS — Z419 Encounter for procedure for purposes other than remedying health state, unspecified: Secondary | ICD-10-CM | POA: Diagnosis not present

## 2022-12-03 ENCOUNTER — Other Ambulatory Visit: Payer: Self-pay | Admitting: Nurse Practitioner

## 2022-12-05 ENCOUNTER — Other Ambulatory Visit: Payer: Self-pay | Admitting: Nurse Practitioner

## 2022-12-14 DIAGNOSIS — F411 Generalized anxiety disorder: Secondary | ICD-10-CM | POA: Diagnosis not present

## 2022-12-14 DIAGNOSIS — F331 Major depressive disorder, recurrent, moderate: Secondary | ICD-10-CM | POA: Diagnosis not present

## 2022-12-19 DIAGNOSIS — F331 Major depressive disorder, recurrent, moderate: Secondary | ICD-10-CM | POA: Diagnosis not present

## 2022-12-19 DIAGNOSIS — F411 Generalized anxiety disorder: Secondary | ICD-10-CM | POA: Diagnosis not present

## 2022-12-21 DIAGNOSIS — F331 Major depressive disorder, recurrent, moderate: Secondary | ICD-10-CM | POA: Diagnosis not present

## 2022-12-21 DIAGNOSIS — F411 Generalized anxiety disorder: Secondary | ICD-10-CM | POA: Diagnosis not present

## 2022-12-28 DIAGNOSIS — F331 Major depressive disorder, recurrent, moderate: Secondary | ICD-10-CM | POA: Diagnosis not present

## 2022-12-28 DIAGNOSIS — F411 Generalized anxiety disorder: Secondary | ICD-10-CM | POA: Diagnosis not present

## 2022-12-30 DIAGNOSIS — Z419 Encounter for procedure for purposes other than remedying health state, unspecified: Secondary | ICD-10-CM | POA: Diagnosis not present

## 2023-01-01 ENCOUNTER — Other Ambulatory Visit: Payer: Self-pay | Admitting: Nurse Practitioner

## 2023-01-01 NOTE — Telephone Encounter (Signed)
Requesting: tamsulosin 0.4 mg capsule, valsartan 160 mg tablet , metoprolol succinate ER 100 mg tablet,extended release 24 hr, metformin ER 500 mg tablet,extended release 24 hr  Last Visit: 11/08/2022 Next Visit: 01/14/2023 Last Refill: 11/07/2022, 11/08/2022, 05/07/2022, stopped Metformin on 11/08/2022  Please Advise

## 2023-01-04 ENCOUNTER — Ambulatory Visit (HOSPITAL_BASED_OUTPATIENT_CLINIC_OR_DEPARTMENT_OTHER)
Admission: RE | Admit: 2023-01-04 | Discharge: 2023-01-04 | Disposition: A | Discharge status: Home / Self Care | Payer: Medicaid Other | Source: Ambulatory Visit | Attending: Internal Medicine | Admitting: Internal Medicine

## 2023-01-04 ENCOUNTER — Encounter (HOSPITAL_BASED_OUTPATIENT_CLINIC_OR_DEPARTMENT_OTHER): Payer: Self-pay

## 2023-01-04 ENCOUNTER — Ambulatory Visit (HOSPITAL_BASED_OUTPATIENT_CLINIC_OR_DEPARTMENT_OTHER)
Admission: EM | Admit: 2023-01-04 | Discharge: 2023-01-04 | Disposition: A | Discharge status: Home / Self Care | Payer: Medicaid Other | Attending: Internal Medicine | Admitting: Internal Medicine

## 2023-01-04 DIAGNOSIS — F411 Generalized anxiety disorder: Secondary | ICD-10-CM | POA: Diagnosis not present

## 2023-01-04 DIAGNOSIS — J209 Acute bronchitis, unspecified: Secondary | ICD-10-CM | POA: Diagnosis not present

## 2023-01-04 DIAGNOSIS — R059 Cough, unspecified: Secondary | ICD-10-CM | POA: Diagnosis not present

## 2023-01-04 DIAGNOSIS — R918 Other nonspecific abnormal finding of lung field: Secondary | ICD-10-CM | POA: Diagnosis not present

## 2023-01-04 DIAGNOSIS — J40 Bronchitis, not specified as acute or chronic: Secondary | ICD-10-CM | POA: Diagnosis not present

## 2023-01-04 DIAGNOSIS — F331 Major depressive disorder, recurrent, moderate: Secondary | ICD-10-CM | POA: Diagnosis not present

## 2023-01-04 DIAGNOSIS — Z87891 Personal history of nicotine dependence: Secondary | ICD-10-CM

## 2023-01-04 MED ORDER — PROMETHAZINE-DM 6.25-15 MG/5ML PO SYRP: 5.0000 mL | ORAL_SOLUTION | Freq: Every evening | ORAL | 0 refills | Status: DC | PRN

## 2023-01-04 MED ORDER — PREDNISONE 20 MG PO TABS: 40.0000 mg | ORAL_TABLET | Freq: Every day | ORAL | 0 refills | Status: AC

## 2023-01-04 MED ORDER — ALBUTEROL SULFATE HFA 108 (90 BASE) MCG/ACT IN AERS: 1.0000 | INHALATION_SPRAY | Freq: Four times a day (QID) | RESPIRATORY_TRACT | 0 refills | Status: AC | PRN

## 2023-01-04 MED ORDER — ALBUTEROL SULFATE (2.5 MG/3ML) 0.083% IN NEBU
2.5000 mg | INHALATION_SOLUTION | Freq: Once | RESPIRATORY_TRACT | Status: AC
  Administered 2023-01-04: 2.5 mg via RESPIRATORY_TRACT

## 2023-01-04 MED ORDER — DEXAMETHASONE SODIUM PHOSPHATE 10 MG/ML IJ SOLN
10.0000 mg | Freq: Once | INTRAMUSCULAR | Status: AC
  Administered 2023-01-04: 10 mg via INTRAMUSCULAR

## 2023-01-04 NOTE — ED Triage Notes (Signed)
Reports chest congestion x 3 weeks. Minimal nasal drainage. + productive cough.

## 2023-01-04 NOTE — Discharge Instructions (Signed)
Please go to med [facility] and have chest x-ray performed. Do not check in to the ER. Go to imaging department, get images performed, then go home. You will receive a phone call if the chest x-ray shows any abnormal results requiring further treatment. If the chest x-ray results do not change our treatment plan, you will not receive a phone call.  Also see these results on MyChart.  Med [facility]  [address] [place], [place]  You have bronchitis which is inflammation of the upper airways in your lungs due to a virus. The following medicines will help with your symptoms.   - Take steroid pills sent to pharmacy as directed. Do not take any other NSAID containing medications such as ibuprofen or naproxen/Aleve while taking prednisone. - You may use albuterol inhaler 1 to 2 puffs every 4-6 hours as needed for cough, shortness of breath, and wheezing. - Take cough medicines as prescribed.  - Continue using over the counter medicines as needed as directed. Plain mucinex (guaifenesin) over the counter may further help breakup mucus and help with symptoms.   If you develop any new or worsening symptoms or do not improve in the next 2 to 3 days, please return.  If your symptoms are severe, please go to the emergency room. Follow-up with PCP as needed.

## 2023-01-04 NOTE — ED Provider Notes (Signed)
[facility]    CSN: [id number] Arrival date & time: 01/04/23  1119      History   Chief Complaint Chief Complaint  Patient presents with   chest congestion    HPI Peter Gutierrez is a 42 y.o. male.   Peter Gutierrez is a 42 y.o. male presenting for chief complaint of chest congestion for the last 3 weeks. Cough is productive with yellow/white sputum and associated with intermittent shortness of breath. Cough is worse at nighttime and he reports bilateral chest soreness with cough. Reports cold chills without documented fever at home. Denies headaches, sore throat, new leg swelling, orthopnea, rashes, N/V/D, abdominal pain, or dizziness. Mother was sick with similar symptoms 2 weeks ago, otherwise no recent sick contacts. Former smoker, denies drug use. Denies history of asthma/chronic respiratory problems. He has been taking mucinex without relief.      Past Medical History:  Diagnosis Date   Elevated liver function tests    Fatty liver    Hypertension    Kidney stone    Morbid obesity (HCC)    PONV (postoperative nausea and vomiting)    Prediabetes    Sinus tachycardia    Sleep apnea     Patient Active Problem List   Diagnosis Date Noted   Bilateral leg edema 11/08/2022   Morbid obesity (HCC) 01/17/2022   Difficulty urinating 01/17/2022   Erectile dysfunction 07/05/2021   Tinea cruris 07/05/2021   Dyspnea on exertion 06/07/2021   Current moderate episode of major depressive disorder without prior episode (HCC) 06/07/2021   Anxiety 06/07/2021   Prediabetes 06/07/2021   Sleep apnea 08/15/2018   Hypertension 08/15/2018    Past Surgical History:  Procedure Laterality Date   CHOLECYSTECTOMY     EXTRACORPOREAL SHOCK WAVE LITHOTRIPSY Right 07/27/2019   Procedure: EXTRACORPOREAL SHOCK WAVE LITHOTRIPSY (ESWL);  Surgeon: Heloise Purpura, MD;  Location: [facility];  Service: Urology;  Laterality: Right;       Home Medications    Prior to  Admission medications   Medication Sig Start Date End Date Taking? Authorizing Provider  albuterol (VENTOLIN HFA) 108 (90 Base) MCG/ACT inhaler Inhale 1-2 puffs into the lungs every 6 (six) hours as needed for wheezing or shortness of breath. 01/04/23  Yes Carlisle Beers, FNP  predniSONE (DELTASONE) 20 MG tablet Take 2 tablets (40 mg total) by mouth daily with breakfast for 5 days. 01/04/23 01/09/23 Yes Carlisle Beers, FNP  promethazine-dextromethorphan (PROMETHAZINE-DM) 6.25-15 MG/5ML syrup Take 5 mLs by mouth at bedtime as needed for cough. 01/04/23  Yes Carlisle Beers, FNP  ALPRAZolam Prudy Feeler) 0.5 MG tablet Take 1 tablet (0.5 mg total) by mouth 2 (two) times daily as needed for anxiety. 04/12/22   McElwee, Lauren A, NP  aspirin 325 MG tablet Take 325 mg by mouth daily.    [provider]  buPROPion (WELLBUTRIN XL) 300 MG 24 hr tablet Take 1 tablet (300 mg total) by mouth daily. 06/29/22   Arfeen, Phillips Grout, MD  cloNIDine (CATAPRES) 0.1 MG tablet Take 1 tablet (0.1 mg total) by mouth at bedtime. 10/18/21   McElwee, Jake Church, NP  Clotrimazole 1 % OINT Apply 1 application. topically daily. 07/05/21   McElwee, Lauren A, NP  DULoxetine (CYMBALTA) 30 MG capsule TAKE ONE CAPSULE EVERY DAY FOR 4 WEEKS, MAY INCREASE TO 2 DAILY AFTER 4 WEEKS 08/21/22   McElwee, Lauren A, NP  lamoTRIgine (LAMICTAL) 150 MG tablet Take one tab daily 08/06/22   Arfeen, Kathi Ludwig  T, MD  lurasidone (LATUDA) 40 MG TABS tablet Take 40 mg by mouth daily with breakfast. 10/31/22   [provider]  meloxicam (MOBIC) 15 MG tablet Take 1 tablet (15 mg total) by mouth daily as needed. 06/05/22   McElwee, Lauren A, NP  metoprolol succinate (TOPROL-XL) 100 MG 24 hr tablet Take 1 tablet by mouth daily. Take with or immediately following a meal. 01/01/23   McElwee, Lauren A, NP  Multiple Vitamin (MULTIVITAMIN ADULT PO) Take by mouth.    [provider]  Omega-3 Fatty Acids (FISH OIL OMEGA-3 PO) Take by mouth.     [provider]  omeprazole (PRILOSEC) 20 MG capsule TAKE ONE CAPSULE BY MOUTH DAILY 12/03/22   McElwee, Lauren A, NP  tadalafil (CIALIS) 20 MG tablet Take 0.5-1 tablets (10-20 mg total) by mouth every other day as needed for erectile dysfunction. 07/05/21   McElwee, Lauren A, NP  tamsulosin (FLOMAX) 0.4 MG CAPS capsule TAKE ONE CAPSULE BY MOUTH DAILY 01/01/23   McElwee, Lauren A, NP  traZODone (DESYREL) 100 MG tablet Take 1 tablet (100 mg total) by mouth at bedtime. 11/07/22   McElwee, Lauren A, NP  valsartan (DIOVAN) 160 MG tablet Take 1 tablet (160 mg total) by mouth daily. 01/01/23   McElwee, Lauren A, NP  valsartan-hydrochlorothiazide (DIOVAN-HCT) 160-25 MG tablet Take 1 tablet by mouth daily. 11/08/22   McElwee, Lauren A, NP  WEGOVY 1.7 MG/0.75ML SOAJ Inject 1.7 mg into the skin once a week. 12/06/22   McElwee, Jake Church, NP    Family History Family History  Problem Relation Age of Onset   Kidney disease Mother    Diabetes Mother    Liver disease Mother    Stroke Father    Diabetes Father    Arthritis Father    Heart disease Father    Hypertension Father    Hyperlipidemia Father    Heart attack Father    GI Bleed Father    Colon cancer Maternal Grandfather    Heart disease Maternal Grandfather    Hypertension Maternal Grandfather    Hyperlipidemia Maternal Grandfather    Heart attack Maternal Grandfather     Social History Social History   Tobacco Use   Smoking status: Former    Current packs/day: 0.00    Average packs/day: 0.5 packs/day for 7.0 years (3.5 ttl pk-yrs)    Types: Cigarettes    Start date: 2004    Quit date: 2011    Years since quitting: 13.9   Smokeless tobacco: Current    Types: Chew  Vaping Use   Vaping status: Never Used  Substance Use Topics   Alcohol use: Yes    Alcohol/week: 49.0 standard drinks of alcohol    Types: 49 Shots of liquor per week   Drug use: Not Currently     Allergies   Patient has no known allergies.   Review of  Systems Review of Systems Per HPI  Physical Exam Triage Vital Signs ED Triage Vitals  Encounter Vitals Group     BP 01/04/23 1138 123/85     Systolic BP Percentile --      Diastolic BP Percentile --      Pulse Rate 01/04/23 1138 71     Resp 01/04/23 1138 20     Temp 01/04/23 1138 98.1 F (36.7 C)     Temp Source 01/04/23 1138 Oral     SpO2 01/04/23 1138 93 %     Weight --      Height --  Head Circumference --      Peak Flow --      Pain Score 01/04/23 1139 0     Pain Loc --      Pain Education --      Exclude from Growth Chart --    No data found.  Updated Vital Signs BP 123/85 (BP Location: Right Arm)   Pulse 71   Temp 98.1 F (36.7 C) (Oral)   Resp 20   SpO2 98%   Visual Acuity Right Eye Distance:   Left Eye Distance:   Bilateral Distance:    Right Eye Near:   Left Eye Near:    Bilateral Near:     Physical Exam Vitals and nursing note reviewed.  Constitutional:      Appearance: He is not ill-appearing or toxic-appearing.  HENT:     Head: Normocephalic and atraumatic.     Right Ear: Hearing, tympanic membrane, ear canal and external ear normal.     Left Ear: Hearing, tympanic membrane, ear canal and external ear normal.     Nose: Nose normal.     Mouth/Throat:     Lips: Pink.     Mouth: Mucous membranes are moist. No injury.     Tongue: No lesions. Tongue does not deviate from midline.     Palate: No mass and lesions.     Pharynx: Oropharynx is clear. Uvula midline. No pharyngeal swelling, oropharyngeal exudate, posterior oropharyngeal erythema or uvula swelling.     Tonsils: No tonsillar exudate or tonsillar abscesses.  Eyes:     General: Lids are normal. Vision grossly intact. Gaze aligned appropriately.     Extraocular Movements: Extraocular movements intact.     Conjunctiva/sclera: Conjunctivae normal.  Cardiovascular:     Rate and Rhythm: Normal rate and regular rhythm.     Heart sounds: Normal heart sounds, S1 normal and S2 normal.   Pulmonary:     Effort: Pulmonary effort is normal. No respiratory distress.     Breath sounds: Normal air entry. Wheezing and rhonchi present. No rales.     Comments: Inspiratory and expiratory wheezing and course rhonchi throughout all lung fields bilaterally. Speaking in full sentences without difficulty.  Chest:     Chest wall: No tenderness.  Musculoskeletal:     Cervical back: Neck supple.     Right lower leg: No edema.     Left lower leg: No edema.  Lymphadenopathy:     Cervical: No cervical adenopathy.  Skin:    General: Skin is warm and dry.     Capillary Refill: Capillary refill takes less than 2 seconds.     Findings: No rash.  Neurological:     General: No focal deficit present.     Mental Status: He is alert and oriented to person, place, and time. Mental status is at baseline.     Cranial Nerves: No dysarthria or facial asymmetry.  Psychiatric:        Mood and Affect: Mood normal.        Speech: Speech normal.        Behavior: Behavior normal.        Thought Content: Thought content normal.        Judgment: Judgment normal.      UC Treatments / Results  Labs (all labs ordered are listed, but only abnormal results are displayed) Labs Reviewed - No data to display  EKG   Radiology No results found.  Procedures Procedures (including critical care time)  Medications Ordered  in UC Medications  dexamethasone (DECADRON) injection 10 mg (10 mg Intramuscular Given 01/04/23 1205)  albuterol (PROVENTIL) (2.5 MG/3ML) 0.083% nebulizer solution 2.5 mg (2.5 mg Nebulization Given 01/04/23 1210)    Initial Impression / Assessment and Plan / UC Course  I have reviewed the triage vital signs and the nursing notes.  Pertinent labs & imaging results that were available during my care of the patient were reviewed by me and considered in my medical decision making (see chart for details).   1. Acute bronchitis, former cigarette smoker Presentation suspicious for  bronchitis, though focal consolidation cannot be ruled out based on cardiopulmonary exam. Outpatient chest x-ray ordered. I will call patient if there is any indication to change treatment plan based on chest x-ray results.  Otherwise, will treat as bronchitis with bronchodilator, steroid, and cough suppressants/expectorants as needed. Albuterol breathing treatment and dexamethasone given in clinic with some improvement in lung sounds afterwards.  Oxygen saturation started at 92% on room air, improved to 98% on room air after breathing treatment.  Counseled patient on potential for adverse effects with medications prescribed/recommended today, strict ER and return-to-clinic precautions discussed, patient verbalized understanding.    Final Clinical Impressions(s) / UC Diagnoses   Final diagnoses:  Acute bronchitis, unspecified organism  Former cigarette smoker     Discharge Instructions      Please go to med [facility] and have chest x-ray performed. Do not check in to the ER. Go to imaging department, get images performed, then go home. You will receive a phone call if the chest x-ray shows any abnormal results requiring further treatment. If the chest x-ray results do not change our treatment plan, you will not receive a phone call.  Also see these results on MyChart.  [facility] [address] [place], [place]  You have bronchitis which is inflammation of the upper airways in your lungs due to a virus. The following medicines will help with your symptoms.   - Take steroid pills sent to pharmacy as directed. Do not take any other NSAID containing medications such as ibuprofen or naproxen/Aleve while taking prednisone. - You may use albuterol inhaler 1 to 2 puffs every 4-6 hours as needed for cough, shortness of breath, and wheezing. - Take cough medicines as prescribed.  - Continue using over the counter medicines as needed as directed. Plain mucinex  (guaifenesin) over the counter may further help breakup mucus and help with symptoms.   If you develop any new or worsening symptoms or do not improve in the next 2 to 3 days, please return.  If your symptoms are severe, please go to the emergency room. Follow-up with PCP as needed.     ED Prescriptions     Medication Sig Dispense Auth. Provider   predniSONE (DELTASONE) 20 MG tablet Take 2 tablets (40 mg total) by mouth daily with breakfast for 5 days. 10 tablet Carlisle Beers, FNP   albuterol (VENTOLIN HFA) 108 (90 Base) MCG/ACT inhaler Inhale 1-2 puffs into the lungs every 6 (six) hours as needed for wheezing or shortness of breath. 8 g Reita May M, FNP   promethazine-dextromethorphan (PROMETHAZINE-DM) 6.25-15 MG/5ML syrup Take 5 mLs by mouth at bedtime as needed for cough. 118 mL Carlisle Beers, FNP      PDMP not reviewed this encounter.   Carlisle Beers, [place] 01/04/23 1234

## 2023-01-11 DIAGNOSIS — F331 Major depressive disorder, recurrent, moderate: Secondary | ICD-10-CM | POA: Diagnosis not present

## 2023-01-11 DIAGNOSIS — F411 Generalized anxiety disorder: Secondary | ICD-10-CM | POA: Diagnosis not present

## 2023-01-14 ENCOUNTER — Ambulatory Visit (INDEPENDENT_AMBULATORY_CARE_PROVIDER_SITE_OTHER): Payer: Medicaid Other | Admitting: Nurse Practitioner

## 2023-01-14 VITALS — BP 134/86 | HR 109 | Temp 97.5°F | Wt 396.2 lb

## 2023-01-14 DIAGNOSIS — J40 Bronchitis, not specified as acute or chronic: Secondary | ICD-10-CM

## 2023-01-14 DIAGNOSIS — R7303 Prediabetes: Secondary | ICD-10-CM | POA: Diagnosis not present

## 2023-01-14 DIAGNOSIS — Z6841 Body Mass Index (BMI) 40.0 and over, adult: Secondary | ICD-10-CM

## 2023-01-14 DIAGNOSIS — M79602 Pain in left arm: Secondary | ICD-10-CM | POA: Diagnosis not present

## 2023-01-14 NOTE — Progress Notes (Signed)
Established Patient Office Visit  Subjective   Patient ID: Peter Gutierrez, male    DOB: Oct 09, 1980  Age: 42 y.o. MRN: [id number]  Chief Complaint  Patient presents with   Medical Management of Chronic Issues    3 months (around 01/15/2023) for HTN, prediabetes. Fasting     HPI  Peter Gutierrez is here to follow-up on prediabetes, left arm pain, and bronchitis.   He states that he went to urgent care on 01/04/23 for a cough and congestion. He had an xray which was negative for pneumonia. He was given a prednisone taper, however didn't finish it because he was having trouble sleeping. He is not currently taking anything for his symptoms.  He endorses some nasal congestion and is ears are popping.  He denies fevers, ear pain, sore throat, shortness of breath.  He notes that his left arm is doing slightly better, however still have some pain around his left elbow especially in certain positions.  He took the prednisone course which he states did not help too much.  He has also been wearing the brace at times and massaging the area.  He does have some tingling in his fingers on his left hand.  He has been checking his blood sugars at home and they typically range from 100-110 in the mornings.  He denies chest pain, shortness of breath, numbness and tingling in his feet.  He has been taking the [facility] without any side effects.   ROS See pertinent positives and negatives per HPI.    Objective:     BP 134/86 (BP Location: Right Arm, Patient Position: Sitting, Cuff Size: Large)   Pulse (!) 109   Temp (!) 97.5 F (36.4 C) (Temporal)   Wt (!) 396 lb 3.2 oz (179.7 kg)   SpO2 95%   BMI 53.73 kg/m  BP Readings from Last 3 Encounters:  01/14/23 134/86  01/04/23 123/85  11/08/22 118/82   Wt Readings from Last 3 Encounters:  01/14/23 (!) 396 lb 3.2 oz (179.7 kg)  11/08/22 (!) 406 lb 12.8 oz (184.5 kg)  10/16/22 (!) 398 lb (180.5 kg)      Physical Exam Vitals and nursing note  reviewed.  Constitutional:      Appearance: Normal appearance. He is obese.  HENT:     Head: Normocephalic.     Right Ear: Tympanic membrane, ear canal and external ear normal.     Left Ear: Ear canal and external ear normal. A middle ear effusion is present.     Nose:     Right Sinus: No maxillary sinus tenderness or frontal sinus tenderness.     Left Sinus: No maxillary sinus tenderness or frontal sinus tenderness.     Mouth/Throat:     Mouth: Mucous membranes are moist.     Pharynx: No posterior oropharyngeal erythema.  Eyes:     Conjunctiva/sclera: Conjunctivae normal.  Cardiovascular:     Rate and Rhythm: Normal rate and regular rhythm.     Pulses: Normal pulses.     Heart sounds: Normal heart sounds.  Pulmonary:     Effort: Pulmonary effort is normal.     Breath sounds: Normal breath sounds.  Musculoskeletal:     Cervical back: Normal range of motion and neck supple. No tenderness.  Lymphadenopathy:     Cervical: No cervical adenopathy.  Skin:    General: Skin is warm.  Neurological:     General: No focal deficit present.     Mental Status: He  is alert and oriented to person, place, and time.  Psychiatric:        Mood and Affect: Mood normal.        Behavior: Behavior normal.        Thought Content: Thought content normal.        Judgment: Judgment normal.      Assessment & Plan:   Problem List Items Addressed This Visit       Other   Prediabetes   Chronic, ongoing. His last A1c was 6.9%, however his fasting glucose was normal. Discussed limiting sugars/carbs and will check A1c next visit.       Morbid obesity (HCC)   He has lost 10 pounds since his last visit. Congratulated him on this. Continue wegovy 1.7mg  injection weekly. Continue focus on nutrition and slowly increase exercise.       Other Visit Diagnoses       Bronchitis    -  Primary   Start mucinex BID. Encourage fluids, rest. Continue zyrtec and start flonase nasal spray for mid ear effusion.  No signs of infection. CXR negative     Left arm pain       Continue voltaren gel. Start massaging area of max tenderness for 2-3 minutes several times a day. Try to keep arm straight at night.       Return in about 3 months (around 04/14/2023) for prediabetes.    Gerre Scull, NP

## 2023-01-14 NOTE — Patient Instructions (Addendum)
It was great to see you!  Start mucinex twice a day.  Drink plenty of fluids  Keep taking the zyrtec and start flonase if able   Use the massage gun to the point that hurts the most for 2-3 minutes several times a day.   Let's follow-up in 3 months, sooner if you have concerns.  If a referral was placed today, you will be contacted for an appointment. Please note that routine referrals can sometimes take up to 3-4 weeks to process. Please call our office if you haven't heard anything after this time frame.  Take care,  Rodman Pickle, NP

## 2023-01-14 NOTE — Assessment & Plan Note (Signed)
He has lost 10 pounds since his last visit. Congratulated him on this. Continue wegovy 1.7mg  injection weekly. Continue focus on nutrition and slowly increase exercise.

## 2023-01-14 NOTE — Assessment & Plan Note (Signed)
Chronic, ongoing. His last A1c was 6.9%, however his fasting glucose was normal. Discussed limiting sugars/carbs and will check A1c next visit.

## 2023-01-19 ENCOUNTER — Other Ambulatory Visit: Payer: Self-pay | Admitting: Nurse Practitioner

## 2023-01-21 NOTE — Telephone Encounter (Signed)
Requesting:  metformin ER 500 mg tablet,extended release 24 hr  Last Visit: 01/14/2023 Next Visit: 04/17/2023 Last Refill: 09/19/2022  Please Advise   The original prescription was discontinued on 10/16/2022 by Gerre Scull, NP. Renewing this prescription may not be appropriate.

## 2023-01-25 DIAGNOSIS — F331 Major depressive disorder, recurrent, moderate: Secondary | ICD-10-CM | POA: Diagnosis not present

## 2023-01-25 DIAGNOSIS — F411 Generalized anxiety disorder: Secondary | ICD-10-CM | POA: Diagnosis not present

## 2023-01-30 DIAGNOSIS — Z419 Encounter for procedure for purposes other than remedying health state, unspecified: Secondary | ICD-10-CM | POA: Diagnosis not present

## 2023-01-31 ENCOUNTER — Other Ambulatory Visit: Payer: Self-pay | Admitting: Nurse Practitioner

## 2023-02-01 DIAGNOSIS — F331 Major depressive disorder, recurrent, moderate: Secondary | ICD-10-CM | POA: Diagnosis not present

## 2023-02-01 DIAGNOSIS — F411 Generalized anxiety disorder: Secondary | ICD-10-CM | POA: Diagnosis not present

## 2023-02-01 NOTE — Telephone Encounter (Signed)
 Requesting:  metformin ER 500 mg tablet,extended release 24 hr, meloxicam 15 mg tablet, trazodone 100 mg tablet  Last Visit: 01/14/2023 Next Visit: 04/17/2023 Last Refill: D/C on 10/16/22,06/05/22, 11/07/22  Please Advise

## 2023-02-06 DIAGNOSIS — F331 Major depressive disorder, recurrent, moderate: Secondary | ICD-10-CM | POA: Diagnosis not present

## 2023-02-06 DIAGNOSIS — F411 Generalized anxiety disorder: Secondary | ICD-10-CM | POA: Diagnosis not present

## 2023-02-08 DIAGNOSIS — F411 Generalized anxiety disorder: Secondary | ICD-10-CM | POA: Diagnosis not present

## 2023-02-08 DIAGNOSIS — F331 Major depressive disorder, recurrent, moderate: Secondary | ICD-10-CM | POA: Diagnosis not present

## 2023-02-15 DIAGNOSIS — F331 Major depressive disorder, recurrent, moderate: Secondary | ICD-10-CM | POA: Diagnosis not present

## 2023-02-15 DIAGNOSIS — F411 Generalized anxiety disorder: Secondary | ICD-10-CM | POA: Diagnosis not present

## 2023-02-16 ENCOUNTER — Other Ambulatory Visit: Payer: Self-pay | Admitting: Nurse Practitioner

## 2023-02-19 NOTE — Telephone Encounter (Signed)
Requesting: omeprazole 20 mg capsule,delayed release  Last Visit: 01/14/2023 Next Visit: 04/19/2023 Last Refill: 12/03/2022  Please Advise

## 2023-02-20 ENCOUNTER — Other Ambulatory Visit: Payer: Self-pay | Admitting: Nurse Practitioner

## 2023-02-20 NOTE — Telephone Encounter (Signed)
Requesting:  Wegovy 1.7 mg/0.75 mL subcutaneous pen injector  Last Visit: 01/14/2023 Next Visit: 04/19/2023 Last Refill: 12/06/2022  Please Advise

## 2023-02-22 DIAGNOSIS — F331 Major depressive disorder, recurrent, moderate: Secondary | ICD-10-CM | POA: Diagnosis not present

## 2023-02-22 DIAGNOSIS — F411 Generalized anxiety disorder: Secondary | ICD-10-CM | POA: Diagnosis not present

## 2023-03-01 DIAGNOSIS — F331 Major depressive disorder, recurrent, moderate: Secondary | ICD-10-CM | POA: Diagnosis not present

## 2023-03-01 DIAGNOSIS — F411 Generalized anxiety disorder: Secondary | ICD-10-CM | POA: Diagnosis not present

## 2023-03-02 DIAGNOSIS — Z419 Encounter for procedure for purposes other than remedying health state, unspecified: Secondary | ICD-10-CM | POA: Diagnosis not present

## 2023-03-08 DIAGNOSIS — F411 Generalized anxiety disorder: Secondary | ICD-10-CM | POA: Diagnosis not present

## 2023-03-08 DIAGNOSIS — F331 Major depressive disorder, recurrent, moderate: Secondary | ICD-10-CM | POA: Diagnosis not present

## 2023-03-15 DIAGNOSIS — F411 Generalized anxiety disorder: Secondary | ICD-10-CM | POA: Diagnosis not present

## 2023-03-15 DIAGNOSIS — F331 Major depressive disorder, recurrent, moderate: Secondary | ICD-10-CM | POA: Diagnosis not present

## 2023-03-22 DIAGNOSIS — F411 Generalized anxiety disorder: Secondary | ICD-10-CM | POA: Diagnosis not present

## 2023-03-22 DIAGNOSIS — F331 Major depressive disorder, recurrent, moderate: Secondary | ICD-10-CM | POA: Diagnosis not present

## 2023-03-25 DIAGNOSIS — F331 Major depressive disorder, recurrent, moderate: Secondary | ICD-10-CM | POA: Diagnosis not present

## 2023-03-25 DIAGNOSIS — F411 Generalized anxiety disorder: Secondary | ICD-10-CM | POA: Diagnosis not present

## 2023-03-26 DIAGNOSIS — F900 Attention-deficit hyperactivity disorder, predominantly inattentive type: Secondary | ICD-10-CM | POA: Diagnosis not present

## 2023-03-28 DIAGNOSIS — F900 Attention-deficit hyperactivity disorder, predominantly inattentive type: Secondary | ICD-10-CM | POA: Diagnosis not present

## 2023-03-29 DIAGNOSIS — F840 Autistic disorder: Secondary | ICD-10-CM | POA: Diagnosis not present

## 2023-03-29 DIAGNOSIS — F411 Generalized anxiety disorder: Secondary | ICD-10-CM | POA: Diagnosis not present

## 2023-03-29 DIAGNOSIS — F900 Attention-deficit hyperactivity disorder, predominantly inattentive type: Secondary | ICD-10-CM | POA: Diagnosis not present

## 2023-03-29 DIAGNOSIS — F331 Major depressive disorder, recurrent, moderate: Secondary | ICD-10-CM | POA: Diagnosis not present

## 2023-03-30 DIAGNOSIS — Z419 Encounter for procedure for purposes other than remedying health state, unspecified: Secondary | ICD-10-CM | POA: Diagnosis not present

## 2023-04-01 ENCOUNTER — Other Ambulatory Visit: Payer: Self-pay | Admitting: Nurse Practitioner

## 2023-04-01 DIAGNOSIS — F411 Generalized anxiety disorder: Secondary | ICD-10-CM | POA: Diagnosis not present

## 2023-04-01 DIAGNOSIS — F331 Major depressive disorder, recurrent, moderate: Secondary | ICD-10-CM | POA: Diagnosis not present

## 2023-04-01 DIAGNOSIS — F900 Attention-deficit hyperactivity disorder, predominantly inattentive type: Secondary | ICD-10-CM | POA: Diagnosis not present

## 2023-04-05 DIAGNOSIS — F411 Generalized anxiety disorder: Secondary | ICD-10-CM | POA: Diagnosis not present

## 2023-04-05 DIAGNOSIS — F900 Attention-deficit hyperactivity disorder, predominantly inattentive type: Secondary | ICD-10-CM | POA: Diagnosis not present

## 2023-04-05 DIAGNOSIS — F331 Major depressive disorder, recurrent, moderate: Secondary | ICD-10-CM | POA: Diagnosis not present

## 2023-04-05 DIAGNOSIS — F840 Autistic disorder: Secondary | ICD-10-CM | POA: Diagnosis not present

## 2023-04-11 DIAGNOSIS — E119 Type 2 diabetes mellitus without complications: Secondary | ICD-10-CM | POA: Diagnosis not present

## 2023-04-11 LAB — HM DIABETES EYE EXAM

## 2023-04-15 DIAGNOSIS — F331 Major depressive disorder, recurrent, moderate: Secondary | ICD-10-CM | POA: Diagnosis not present

## 2023-04-15 DIAGNOSIS — F900 Attention-deficit hyperactivity disorder, predominantly inattentive type: Secondary | ICD-10-CM | POA: Diagnosis not present

## 2023-04-15 DIAGNOSIS — F411 Generalized anxiety disorder: Secondary | ICD-10-CM | POA: Diagnosis not present

## 2023-04-17 ENCOUNTER — Ambulatory Visit: Payer: Medicaid Other | Admitting: Nurse Practitioner

## 2023-04-17 ENCOUNTER — Encounter: Payer: Self-pay | Admitting: Nurse Practitioner

## 2023-04-18 ENCOUNTER — Other Ambulatory Visit: Payer: Self-pay | Admitting: Nurse Practitioner

## 2023-04-18 NOTE — Telephone Encounter (Signed)
 Requesting: omeprazole 20 mg capsule,delayed release  Last Visit: 01/14/2023 Next Visit: 04/19/2023 Last Refill: 02/19/2023  Please Advise

## 2023-04-19 ENCOUNTER — Encounter: Payer: Self-pay | Admitting: Nurse Practitioner

## 2023-04-19 ENCOUNTER — Ambulatory Visit (INDEPENDENT_AMBULATORY_CARE_PROVIDER_SITE_OTHER): Payer: Medicaid Other | Admitting: Nurse Practitioner

## 2023-04-19 VITALS — BP 124/88 | HR 60 | Temp 97.2°F | Ht 76.0 in | Wt >= 6400 oz

## 2023-04-19 DIAGNOSIS — N644 Mastodynia: Secondary | ICD-10-CM | POA: Diagnosis not present

## 2023-04-19 DIAGNOSIS — E119 Type 2 diabetes mellitus without complications: Secondary | ICD-10-CM | POA: Diagnosis not present

## 2023-04-19 DIAGNOSIS — F321 Major depressive disorder, single episode, moderate: Secondary | ICD-10-CM | POA: Diagnosis not present

## 2023-04-19 DIAGNOSIS — F840 Autistic disorder: Secondary | ICD-10-CM | POA: Diagnosis not present

## 2023-04-19 DIAGNOSIS — R7303 Prediabetes: Secondary | ICD-10-CM

## 2023-04-19 DIAGNOSIS — R7989 Other specified abnormal findings of blood chemistry: Secondary | ICD-10-CM | POA: Diagnosis not present

## 2023-04-19 DIAGNOSIS — Z7984 Long term (current) use of oral hypoglycemic drugs: Secondary | ICD-10-CM

## 2023-04-19 DIAGNOSIS — F411 Generalized anxiety disorder: Secondary | ICD-10-CM | POA: Diagnosis not present

## 2023-04-19 DIAGNOSIS — Z7985 Long-term (current) use of injectable non-insulin antidiabetic drugs: Secondary | ICD-10-CM

## 2023-04-19 DIAGNOSIS — F900 Attention-deficit hyperactivity disorder, predominantly inattentive type: Secondary | ICD-10-CM | POA: Diagnosis not present

## 2023-04-19 DIAGNOSIS — F331 Major depressive disorder, recurrent, moderate: Secondary | ICD-10-CM | POA: Diagnosis not present

## 2023-04-19 LAB — COMPREHENSIVE METABOLIC PANEL
ALT: 60 U/L — ABNORMAL HIGH (ref 0–53)
AST: 59 U/L — ABNORMAL HIGH (ref 0–37)
Albumin: 3.8 g/dL (ref 3.5–5.2)
Alkaline Phosphatase: 44 U/L (ref 39–117)
BUN: 18 mg/dL (ref 6–23)
CO2: 30 meq/L (ref 19–32)
Calcium: 9.2 mg/dL (ref 8.4–10.5)
Chloride: 106 meq/L (ref 96–112)
Creatinine, Ser: 0.94 mg/dL (ref 0.40–1.50)
GFR: 99.33 mL/min (ref >60.00)
Glucose, Bld: 123 mg/dL — ABNORMAL HIGH (ref 70–99)
Potassium: 4.8 meq/L (ref 3.5–5.1)
Sodium: 144 meq/L (ref 135–145)
Total Bilirubin: 0.3 mg/dL (ref 0.2–1.2)
Total Protein: 6.1 g/dL (ref 6.0–8.3)

## 2023-04-19 LAB — CBC WITH DIFFERENTIAL/PLATELET
Basophils Absolute: 0.1 10*3/uL (ref 0.0–0.1)
Basophils Relative: 0.8 % (ref 0.0–3.0)
Eosinophils Absolute: 0.5 10*3/uL (ref 0.0–0.7)
Eosinophils Relative: 6.9 % — ABNORMAL HIGH (ref 0.0–5.0)
HCT: 40.2 % (ref 39.0–52.0)
Hemoglobin: 13.2 g/dL (ref 13.0–17.0)
Lymphocytes Relative: 27.7 % (ref 12.0–46.0)
Lymphs Abs: 1.9 10*3/uL (ref 0.7–4.0)
MCHC: 32.8 g/dL (ref 30.0–36.0)
MCV: 81.5 fl (ref 78.0–100.0)
Monocytes Absolute: 0.8 10*3/uL (ref 0.1–1.0)
Monocytes Relative: 12 % (ref 3.0–12.0)
Neutro Abs: 3.7 10*3/uL (ref 1.4–7.7)
Neutrophils Relative %: 52.6 % (ref 43.0–77.0)
Platelets: 174 10*3/uL (ref 150.0–400.0)
RBC: 4.93 Mil/uL (ref 4.22–5.81)
RDW: 16.1 % — ABNORMAL HIGH (ref 11.5–15.5)
WBC: 7 10*3/uL (ref 4.0–10.5)

## 2023-04-19 LAB — TSH: TSH: 1.91 u[IU]/mL (ref 0.35–5.50)

## 2023-04-19 LAB — TESTOSTERONE: Testosterone: 94.79 ng/dL — ABNORMAL LOW (ref 300.00–890.00)

## 2023-04-19 LAB — HEMOGLOBIN A1C: Hgb A1c MFr Bld: 6.2 % (ref 4.6–6.5)

## 2023-04-19 MED ORDER — SEMAGLUTIDE (2 MG/DOSE) 8 MG/3ML ~~LOC~~ SOPN: 2.0000 mg | PEN_INJECTOR | SUBCUTANEOUS | 2 refills | Status: DC

## 2023-04-19 NOTE — Assessment & Plan Note (Signed)
 Check CMP today. US showed steatosis. May need referral to GI.

## 2023-04-19 NOTE — Assessment & Plan Note (Signed)
 A1c was elevated in diabetic range last visit. Will check CMP, A1c, CBC today. If still elevated, will switch from wegovy to ozempic. Continue metformin XR 500mg  daily.

## 2023-04-19 NOTE — Assessment & Plan Note (Signed)
 Breast is present without nipple drainage. Reviewed medication list. Check testosterone, sex binding hormone, and breast ultrasound.

## 2023-04-19 NOTE — Patient Instructions (Signed)
 It was great to see you!  We are checking your labs today and will let you know the results via mychart/phone.   I have ordered ultrasound of your chest  Let's follow-up in 3 months, sooner if you have concerns.  If a referral was placed today, you will be contacted for an appointment. Please note that routine referrals can sometimes take up to 3-4 weeks to process. Please call our office if you haven't heard anything after this time frame.  Take care,  Rodman Pickle, NP

## 2023-04-19 NOTE — Assessment & Plan Note (Signed)
 Chronic, stable. He denies SI/HI.  He is following with psychiatry and is taking lamictal 150mg  daily, latuda 40mg  daily, wellbutrin 300mg  daily, cymbalta 30mg  dialy, and xanax as needed. Continue collaboration and recommendations from psychiatry.

## 2023-04-19 NOTE — Addendum Note (Signed)
 Addended by: Rodman Pickle A on: 04/19/2023 12:36 PM   Modules accepted: Orders

## 2023-04-19 NOTE — Progress Notes (Signed)
 Established Patient Office Visit  Subjective   Patient ID: Peter Gutierrez, male    DOB: January 16, 1981  Age: 43 y.o. MRN: [id number]  Chief Complaint  Patient presents with   Medication Management    Follow up, concerns with nipple pain for 2 months    HPI Discussed the use of AI scribe software for clinical note transcription with the patient, who gave verbal consent to proceed.  History of Present Illness   The patient presents with nipple pain, particularly upon waking. He denies any associated drainage. There have been no changes in diet and no new medications. The patient's mood and depression have been stable. He denies chest pain but admits to shortness of breath with exertion. He checks his blood sugars once a week, with readings around 120. He notes that the wegovy is not helping as much as the ozempic did. He is still having hunger pangs and overeating.       ROS See pertinent positives and negatives per HPI.    Objective:     BP 124/88 (BP Location: Right Arm, Patient Position: Sitting, Cuff Size: Large)   Pulse 60   Temp (!) 97.2 F (36.2 C)   Ht 6\' 4"  (1.93 m)   Wt (!) 404 lb 3.2 oz (183.3 kg)   SpO2 97%   BMI 49.20 kg/m  BP Readings from Last 3 Encounters:  04/19/23 124/88  01/14/23 134/86  01/04/23 123/85   Wt Readings from Last 3 Encounters:  04/19/23 (!) 404 lb 3.2 oz (183.3 kg)  01/14/23 (!) 396 lb 3.2 oz (179.7 kg)  11/08/22 (!) 406 lb 12.8 oz (184.5 kg)      Physical Exam Vitals and nursing note reviewed.  Constitutional:      Appearance: Normal appearance.  HENT:     Head: Normocephalic.  Eyes:     Conjunctiva/sclera: Conjunctivae normal.  Cardiovascular:     Rate and Rhythm: Normal rate and regular rhythm.     Pulses: Normal pulses.     Heart sounds: Normal heart sounds.  Pulmonary:     Effort: Pulmonary effort is normal.     Breath sounds: Normal breath sounds.  Chest:  Breasts:    Right: Tenderness present. No inverted nipple or  nipple discharge.     Left: Tenderness present. No inverted nipple or nipple discharge.  Musculoskeletal:     Cervical back: Normal range of motion.  Skin:    General: Skin is warm.  Neurological:     General: No focal deficit present.     Mental Status: He is alert and oriented to person, place, and time.  Psychiatric:        Mood and Affect: Mood normal.        Behavior: Behavior normal.        Thought Content: Thought content normal.        Judgment: Judgment normal.      Assessment & Plan:   Problem List Items Addressed This Visit       Other   Current moderate episode of major depressive disorder without prior episode (HCC)   Chronic, stable. He denies SI/HI.  He is following with psychiatry and is taking lamictal 150mg  daily, latuda 40mg  daily, wellbutrin 300mg  daily, cymbalta 30mg  dialy, and xanax as needed. Continue collaboration and recommendations from psychiatry.       Prediabetes   A1c was elevated in diabetic range last visit. Will check CMP, A1c, CBC today. If still elevated, will switch from wegovy to  ozempic. Continue metformin XR 500mg  daily.       Relevant Orders   Hemoglobin A1c   Elevated LFTs   Check CMP today. US showed steatosis. May need referral to GI.       Relevant Orders   CBC with Differential/Platelet   Comprehensive metabolic panel   Breast pain - Primary   Breast is present without nipple drainage. Reviewed medication list. Check testosterone, sex binding hormone, and breast ultrasound.      Relevant Orders   Testosterone   Sex hormone binding globulin   TSH   US BREAST COMPLETE UNI LEFT INC AXILLA   US BREAST COMPLETE UNI RIGHT INC AXILLA    Return in about 3 months (around 07/20/2023) for CPE.    Gerre Scull, NP

## 2023-04-20 LAB — SEX HORMONE BINDING GLOBULIN: Sex Hormone Binding: 52 nmol/L — ABNORMAL HIGH (ref 10–50)

## 2023-04-22 ENCOUNTER — Other Ambulatory Visit: Payer: Self-pay | Admitting: Nurse Practitioner

## 2023-04-22 DIAGNOSIS — N644 Mastodynia: Secondary | ICD-10-CM

## 2023-04-23 ENCOUNTER — Telehealth: Payer: Self-pay

## 2023-04-23 ENCOUNTER — Other Ambulatory Visit (HOSPITAL_COMMUNITY): Payer: Self-pay

## 2023-04-23 NOTE — Telephone Encounter (Signed)
 Pharmacy Patient Advocate Encounter  Received notification from [facility] Medicaid that Prior Authorization for Ozempic 8mg /15ml has been APPROVED from 04/09/23 to 04/22/24. Ran test claim, Copay is $4.00. This test claim was processed through [facility]- copay amounts may vary at other pharmacies due to pharmacy/plan contracts, or as the patient moves through the different stages of their insurance plan.   PA #/Case ID/Reference #: [id number]

## 2023-04-23 NOTE — Telephone Encounter (Signed)
 Received a fax from Cover my meds that a prior auth is needed for Ozempic 2mg Dose Strength 8mg /75ml 3 per 28 days   Key: [id number]

## 2023-04-23 NOTE — Telephone Encounter (Signed)
 Pharmacy Patient Advocate Encounter   Received notification from Pt Calls Messages that prior authorization for Ozempic  is required/requested.   Insurance verification completed.   The patient is insured through [facility] .   Per test claim: PA required; PA submitted to above mentioned insurance via CoverMyMeds Key/confirmation #/EOC [id number] Status is pending

## 2023-04-26 DIAGNOSIS — F411 Generalized anxiety disorder: Secondary | ICD-10-CM | POA: Diagnosis not present

## 2023-04-26 DIAGNOSIS — F331 Major depressive disorder, recurrent, moderate: Secondary | ICD-10-CM | POA: Diagnosis not present

## 2023-04-26 DIAGNOSIS — F900 Attention-deficit hyperactivity disorder, predominantly inattentive type: Secondary | ICD-10-CM | POA: Diagnosis not present

## 2023-04-26 DIAGNOSIS — F840 Autistic disorder: Secondary | ICD-10-CM | POA: Diagnosis not present

## 2023-04-30 ENCOUNTER — Encounter: Payer: Self-pay | Admitting: Nurse Practitioner

## 2023-04-30 ENCOUNTER — Ambulatory Visit
Admission: RE | Admit: 2023-04-30 | Discharge: 2023-04-30 | Discharge status: Home / Self Care | Source: Ambulatory Visit | Attending: Nurse Practitioner | Admitting: Nurse Practitioner

## 2023-04-30 ENCOUNTER — Ambulatory Visit

## 2023-04-30 ENCOUNTER — Other Ambulatory Visit: Payer: Self-pay | Admitting: Nurse Practitioner

## 2023-04-30 DIAGNOSIS — N644 Mastodynia: Secondary | ICD-10-CM

## 2023-04-30 NOTE — Telephone Encounter (Signed)
 Requesting: metformin ER 500 mg tablet,extended release 24 hr , meloxicam 15 mg tablet , trazodone 100 mg tablet  Last Visit: 04/19/2023 Next Visit: 07/22/2023 Last Refill: 02/01/2023  Please Advise

## 2023-05-10 ENCOUNTER — Other Ambulatory Visit (INDEPENDENT_AMBULATORY_CARE_PROVIDER_SITE_OTHER)

## 2023-05-10 DIAGNOSIS — R7989 Other specified abnormal findings of blood chemistry: Secondary | ICD-10-CM

## 2023-05-10 DIAGNOSIS — F411 Generalized anxiety disorder: Secondary | ICD-10-CM | POA: Diagnosis not present

## 2023-05-10 DIAGNOSIS — F900 Attention-deficit hyperactivity disorder, predominantly inattentive type: Secondary | ICD-10-CM | POA: Diagnosis not present

## 2023-05-10 DIAGNOSIS — F331 Major depressive disorder, recurrent, moderate: Secondary | ICD-10-CM | POA: Diagnosis not present

## 2023-05-10 DIAGNOSIS — F840 Autistic disorder: Secondary | ICD-10-CM | POA: Diagnosis not present

## 2023-05-11 DIAGNOSIS — Z419 Encounter for procedure for purposes other than remedying health state, unspecified: Secondary | ICD-10-CM | POA: Diagnosis not present

## 2023-05-11 LAB — FSH/LH
FSH: 2.4 m[IU]/mL (ref 1.4–12.8)
LH: 1.4 m[IU]/mL — ABNORMAL LOW (ref 1.5–9.3)

## 2023-05-11 LAB — PROLACTIN: Prolactin: 83.4 ng/mL — ABNORMAL HIGH (ref 2.0–18.0)

## 2023-05-13 ENCOUNTER — Encounter: Payer: Self-pay | Admitting: Nurse Practitioner

## 2023-05-13 DIAGNOSIS — F411 Generalized anxiety disorder: Secondary | ICD-10-CM | POA: Diagnosis not present

## 2023-05-13 DIAGNOSIS — F900 Attention-deficit hyperactivity disorder, predominantly inattentive type: Secondary | ICD-10-CM | POA: Diagnosis not present

## 2023-05-13 DIAGNOSIS — F331 Major depressive disorder, recurrent, moderate: Secondary | ICD-10-CM | POA: Diagnosis not present

## 2023-05-13 NOTE — Addendum Note (Signed)
 Addended by: Boden Stucky A on: 05/13/2023 08:25 AM   Modules accepted: Orders

## 2023-05-15 ENCOUNTER — Other Ambulatory Visit (HOSPITAL_COMMUNITY): Payer: Self-pay

## 2023-05-15 LAB — TESTOSTERONE,FREE AND TOTAL
Testosterone, Free: 3 pg/mL — ABNORMAL LOW (ref 6.8–21.5)
Testosterone: 142 ng/dL — ABNORMAL LOW (ref 264–916)

## 2023-05-18 ENCOUNTER — Other Ambulatory Visit: Payer: Self-pay | Admitting: Nurse Practitioner

## 2023-05-20 NOTE — Telephone Encounter (Signed)
 Requesting: valsartan  160 mg tablet  Last Visit: 04/19/2023 Next Visit: 07/22/2023 Last Refill: 01/01/2023  Please Advise

## 2023-05-24 DIAGNOSIS — F331 Major depressive disorder, recurrent, moderate: Secondary | ICD-10-CM | POA: Diagnosis not present

## 2023-05-24 DIAGNOSIS — F411 Generalized anxiety disorder: Secondary | ICD-10-CM | POA: Diagnosis not present

## 2023-05-24 DIAGNOSIS — F900 Attention-deficit hyperactivity disorder, predominantly inattentive type: Secondary | ICD-10-CM | POA: Diagnosis not present

## 2023-05-24 DIAGNOSIS — F840 Autistic disorder: Secondary | ICD-10-CM | POA: Diagnosis not present

## 2023-05-27 DIAGNOSIS — F411 Generalized anxiety disorder: Secondary | ICD-10-CM | POA: Diagnosis not present

## 2023-05-27 DIAGNOSIS — F900 Attention-deficit hyperactivity disorder, predominantly inattentive type: Secondary | ICD-10-CM | POA: Diagnosis not present

## 2023-05-27 DIAGNOSIS — F331 Major depressive disorder, recurrent, moderate: Secondary | ICD-10-CM | POA: Diagnosis not present

## 2023-06-02 ENCOUNTER — Other Ambulatory Visit: Payer: Self-pay | Admitting: Nurse Practitioner

## 2023-06-03 NOTE — Telephone Encounter (Signed)
 Requesting: omeprazole  20 mg capsule,delayed release  Last Visit: 04/19/2023 Next Visit: 07/22/2023 Last Refill: 04/18/2023  Please Advise

## 2023-06-10 ENCOUNTER — Telehealth: Payer: Self-pay

## 2023-06-10 NOTE — Telephone Encounter (Signed)
 Copied from CRM [id number]. Topic: Referral - Status >> Jun 10, 2023 11:32 AM Luane Rumps D wrote: Reason for CRM: Pattie Borders with [facility] called to advise that they do not accept Mr. Baldez insurance and his referral will need to be sent elsewhere.

## 2023-06-11 NOTE — Telephone Encounter (Signed)
 Noted.

## 2023-06-12 ENCOUNTER — Encounter: Payer: Self-pay | Admitting: Nurse Practitioner

## 2023-06-16 ENCOUNTER — Other Ambulatory Visit

## 2023-06-17 ENCOUNTER — Other Ambulatory Visit

## 2023-06-17 ENCOUNTER — Encounter

## 2023-06-21 ENCOUNTER — Encounter: Payer: Self-pay | Admitting: Nurse Practitioner

## 2023-06-21 ENCOUNTER — Telehealth: Payer: Self-pay

## 2023-06-21 NOTE — Telephone Encounter (Signed)
 Can we get a prior auth on Ozempic  2mg  dose?

## 2023-06-25 ENCOUNTER — Telehealth: Payer: Self-pay

## 2023-06-25 ENCOUNTER — Other Ambulatory Visit (HOSPITAL_COMMUNITY): Payer: Self-pay

## 2023-06-25 NOTE — Telephone Encounter (Signed)
 I called and spoke with patient and asked him if he had another insurance to proceed with prior auth and he said that the state had switched his insurance to [employer] and he called to get it switched back to [facility]. Peter Gutierrez said that the switch will not be active till the end of the month and will be the exact same insurance that he had.   I asked patient to call the office and please let me know once the change has been made so that we can proceed again to get prior auth on the Ozempic .

## 2023-06-25 NOTE — Telephone Encounter (Signed)
    Pts Insurance terminated on 4.30.25 per pts insurance and test claim

## 2023-06-25 NOTE — Telephone Encounter (Signed)
 Hi [place],                     I have contacted the pts pharmacy and the patients Insurance terminated on 4.30.25.I do not see any other Insurance Listed. Unfortunately we can't proceed with a P/A without  Insurance

## 2023-06-26 NOTE — Telephone Encounter (Signed)
 Noted

## 2023-07-01 DIAGNOSIS — F900 Attention-deficit hyperactivity disorder, predominantly inattentive type: Secondary | ICD-10-CM | POA: Diagnosis not present

## 2023-07-01 DIAGNOSIS — F411 Generalized anxiety disorder: Secondary | ICD-10-CM | POA: Diagnosis not present

## 2023-07-01 DIAGNOSIS — F331 Major depressive disorder, recurrent, moderate: Secondary | ICD-10-CM | POA: Diagnosis not present

## 2023-07-03 ENCOUNTER — Other Ambulatory Visit: Payer: Self-pay | Admitting: Nurse Practitioner

## 2023-07-03 DIAGNOSIS — E119 Type 2 diabetes mellitus without complications: Secondary | ICD-10-CM

## 2023-07-03 NOTE — Telephone Encounter (Signed)
 Requesting: Ozempic  2 mg/dose (8 mg/3 mL) subcutaneous pen injector  Last Visit: 04/19/2023 Next Visit: 07/22/2023 Last Refill: 04/19/2023  Please Advise

## 2023-07-05 ENCOUNTER — Encounter: Payer: Self-pay | Admitting: Nurse Practitioner

## 2023-07-05 DIAGNOSIS — F331 Major depressive disorder, recurrent, moderate: Secondary | ICD-10-CM | POA: Diagnosis not present

## 2023-07-05 DIAGNOSIS — F411 Generalized anxiety disorder: Secondary | ICD-10-CM | POA: Diagnosis not present

## 2023-07-05 DIAGNOSIS — F840 Autistic disorder: Secondary | ICD-10-CM | POA: Diagnosis not present

## 2023-07-05 DIAGNOSIS — F900 Attention-deficit hyperactivity disorder, predominantly inattentive type: Secondary | ICD-10-CM | POA: Diagnosis not present

## 2023-07-08 ENCOUNTER — Other Ambulatory Visit (HOSPITAL_COMMUNITY): Payer: Self-pay

## 2023-07-08 ENCOUNTER — Telehealth: Payer: Self-pay

## 2023-07-08 NOTE — Telephone Encounter (Signed)
    Update:  Aleck Hurdle,                 Our team has thoroughly checked and ran test claims for both [employer] and [place] and neither Insurance is active. The patient should reach out to who he has been speaking with for an update. Please see the test claims below   [employer]: covg terminated          [facility]: patient not covered

## 2023-07-08 NOTE — Telephone Encounter (Addendum)
 Peter Gutierrez

## 2023-07-08 NOTE — Telephone Encounter (Signed)
 Mychart message sent to patient.

## 2023-07-08 NOTE — Telephone Encounter (Signed)
 Peter Gutierrez,              Our team has thoroughly checked and ran test claims for both [place] and [place] and neither Insurance is active. The patient should reach out to who he has been speaking with for an update. Please see the test claims below  [facility]

## 2023-07-11 ENCOUNTER — Other Ambulatory Visit: Payer: Self-pay | Admitting: Medical Genetics

## 2023-07-11 DIAGNOSIS — Z419 Encounter for procedure for purposes other than remedying health state, unspecified: Secondary | ICD-10-CM | POA: Diagnosis not present

## 2023-07-15 DIAGNOSIS — F900 Attention-deficit hyperactivity disorder, predominantly inattentive type: Secondary | ICD-10-CM | POA: Diagnosis not present

## 2023-07-15 DIAGNOSIS — F331 Major depressive disorder, recurrent, moderate: Secondary | ICD-10-CM | POA: Diagnosis not present

## 2023-07-15 DIAGNOSIS — F411 Generalized anxiety disorder: Secondary | ICD-10-CM | POA: Diagnosis not present

## 2023-07-16 ENCOUNTER — Encounter: Payer: Self-pay | Admitting: Gastroenterology

## 2023-07-19 DIAGNOSIS — F900 Attention-deficit hyperactivity disorder, predominantly inattentive type: Secondary | ICD-10-CM | POA: Diagnosis not present

## 2023-07-19 DIAGNOSIS — F411 Generalized anxiety disorder: Secondary | ICD-10-CM | POA: Diagnosis not present

## 2023-07-19 DIAGNOSIS — F331 Major depressive disorder, recurrent, moderate: Secondary | ICD-10-CM | POA: Diagnosis not present

## 2023-07-19 DIAGNOSIS — F840 Autistic disorder: Secondary | ICD-10-CM | POA: Diagnosis not present

## 2023-07-22 ENCOUNTER — Ambulatory Visit: Payer: Self-pay | Admitting: Nurse Practitioner

## 2023-07-22 ENCOUNTER — Ambulatory Visit (INDEPENDENT_AMBULATORY_CARE_PROVIDER_SITE_OTHER): Admitting: Nurse Practitioner

## 2023-07-22 ENCOUNTER — Telehealth: Payer: Self-pay

## 2023-07-22 VITALS — BP 126/84 | HR 84 | Temp 96.8°F | Ht 76.0 in | Wt 383.6 lb

## 2023-07-22 DIAGNOSIS — Z7984 Long term (current) use of oral hypoglycemic drugs: Secondary | ICD-10-CM | POA: Insufficient documentation

## 2023-07-22 DIAGNOSIS — F321 Major depressive disorder, single episode, moderate: Secondary | ICD-10-CM

## 2023-07-22 DIAGNOSIS — Z7985 Long-term (current) use of injectable non-insulin antidiabetic drugs: Secondary | ICD-10-CM | POA: Diagnosis not present

## 2023-07-22 DIAGNOSIS — G4733 Obstructive sleep apnea (adult) (pediatric): Secondary | ICD-10-CM | POA: Diagnosis not present

## 2023-07-22 DIAGNOSIS — Z0000 Encounter for general adult medical examination without abnormal findings: Secondary | ICD-10-CM | POA: Diagnosis not present

## 2023-07-22 DIAGNOSIS — N529 Male erectile dysfunction, unspecified: Secondary | ICD-10-CM

## 2023-07-22 DIAGNOSIS — F419 Anxiety disorder, unspecified: Secondary | ICD-10-CM | POA: Diagnosis not present

## 2023-07-22 DIAGNOSIS — E119 Type 2 diabetes mellitus without complications: Secondary | ICD-10-CM | POA: Diagnosis not present

## 2023-07-22 DIAGNOSIS — Z23 Encounter for immunization: Secondary | ICD-10-CM

## 2023-07-22 DIAGNOSIS — I10 Essential (primary) hypertension: Secondary | ICD-10-CM

## 2023-07-22 LAB — CBC WITH DIFFERENTIAL/PLATELET
Basophils Absolute: 0 10*3/uL (ref 0.0–0.1)
Basophils Relative: 0.6 % (ref 0.0–3.0)
Eosinophils Absolute: 0.4 10*3/uL (ref 0.0–0.7)
Eosinophils Relative: 6.2 % — ABNORMAL HIGH (ref 0.0–5.0)
HCT: 42.5 % (ref 39.0–52.0)
Hemoglobin: 13.7 g/dL (ref 13.0–17.0)
Lymphocytes Relative: 28.3 % (ref 12.0–46.0)
Lymphs Abs: 2 10*3/uL (ref 0.7–4.0)
MCHC: 32.1 g/dL (ref 30.0–36.0)
MCV: 81.5 fl (ref 78.0–100.0)
Monocytes Absolute: 0.6 10*3/uL (ref 0.1–1.0)
Monocytes Relative: 8.9 % (ref 3.0–12.0)
Neutro Abs: 3.9 10*3/uL (ref 1.4–7.7)
Neutrophils Relative %: 56 % (ref 43.0–77.0)
Platelets: 180 10*3/uL (ref 150.0–400.0)
RBC: 5.22 Mil/uL (ref 4.22–5.81)
RDW: 14.9 % (ref 11.5–15.5)
WBC: 7 10*3/uL (ref 4.0–10.5)

## 2023-07-22 LAB — COMPREHENSIVE METABOLIC PANEL WITH GFR
ALT: 63 U/L — ABNORMAL HIGH (ref 0–53)
AST: 62 U/L — ABNORMAL HIGH (ref 0–37)
Albumin: 3.9 g/dL (ref 3.5–5.2)
Alkaline Phosphatase: 43 U/L (ref 39–117)
BUN: 12 mg/dL (ref 6–23)
CO2: 25 meq/L (ref 19–32)
Calcium: 9.3 mg/dL (ref 8.4–10.5)
Chloride: 108 meq/L (ref 96–112)
Creatinine, Ser: 1.05 mg/dL (ref 0.40–1.50)
GFR: 86.82 mL/min (ref >60.00)
Glucose, Bld: 113 mg/dL — ABNORMAL HIGH (ref 70–99)
Potassium: 4.3 meq/L (ref 3.5–5.1)
Sodium: 142 meq/L (ref 135–145)
Total Bilirubin: 0.4 mg/dL (ref 0.2–1.2)
Total Protein: 6.8 g/dL (ref 6.0–8.3)

## 2023-07-22 LAB — LIPID PANEL
Cholesterol: 112 mg/dL (ref 0–200)
HDL: 35.9 mg/dL — ABNORMAL LOW (ref >39.00)
LDL Cholesterol: 46 mg/dL (ref 0–99)
NonHDL: 76.27
Total CHOL/HDL Ratio: 3
Triglycerides: 153 mg/dL — ABNORMAL HIGH (ref 0.0–149.0)
VLDL: 30.6 mg/dL (ref 0.0–40.0)

## 2023-07-22 LAB — HEMOGLOBIN A1C: Hgb A1c MFr Bld: 6.3 % (ref 4.6–6.5)

## 2023-07-22 MED ORDER — TADALAFIL 20 MG PO TABS: 10.0000 mg | ORAL_TABLET | ORAL | 11 refills | Status: DC | PRN

## 2023-07-22 NOTE — Assessment & Plan Note (Addendum)
 Chronic, stable. Check A1c, CMP, lipid panel today. Continue metformin  XR 500 and ozempic  2mg  weekly. Up to date on eye exam. Follow-up in 3 months.

## 2023-07-22 NOTE — Progress Notes (Signed)
 BP 126/84 (BP Location: Left Arm, Cuff Size: Large)   Pulse 84   Temp (!) 96.8 F (36 C)   Ht 6' 4 (1.93 m)   Wt (!) 383 lb 9.6 oz (174 kg)   SpO2 95%   BMI 46.69 kg/m    Subjective:    Patient ID: Peter Gutierrez, male    DOB: 05/03/1980, 43 y.o.   MRN: [id number]  CC: Chief Complaint  Patient presents with   Annual Exam    With fasting lab work, no concerns    HPI: Peter Gutierrez is a 43 y.o. male presenting on 07/22/2023 for comprehensive medical examination. Current medical complaints include:none  He currently lives with: wife, kids  Depression and Anxiety Screen done today and results listed below:     07/22/2023    8:41 AM 04/19/2023    8:32 AM 01/14/2023    8:19 AM 10/16/2022    8:33 AM 07/16/2022    9:56 AM  Depression screen PHQ 2/9  Decreased Interest 1 1 1 3 1   Down, Depressed, Hopeless 1 0 1 3 1   PHQ - 2 Score 2 1 2 6 2   Altered sleeping 1 1 1 3 3   Tired, decreased energy 1 1 1 3 3   Change in appetite 1 2 1 3 3   Feeling bad or failure about yourself  0 0 1 3 2   Trouble concentrating 0 0 1 2 3   Moving slowly or fidgety/restless 0 0 1 3 2   Suicidal thoughts 0 0 0 0 0  PHQ-9 Score 5 5 8 23 18   Difficult doing work/chores Somewhat difficult Not difficult at all  Extremely dIfficult Very difficult      07/22/2023    8:42 AM 04/19/2023    8:32 AM 01/14/2023    8:19 AM 10/16/2022    8:34 AM  GAD 7 : Generalized Anxiety Score  Nervous, Anxious, on Edge 1 1 1 3   Control/stop worrying 1 0 1 3  Worry too much - different things 1 1 1 3   Trouble relaxing 1 1 1 2   Restless 1 2 1 3   Easily annoyed or irritable 1 1 1 2   Afraid - awful might happen 0 0 1 3  Total GAD 7 Score 6 6 7 19   Anxiety Difficulty Somewhat difficult Somewhat difficult Somewhat difficult Extremely difficult    The patient does not have a history of falls. I did not complete a risk assessment for falls. A plan of care for falls was not documented.   Past Medical History:  Past  Medical History:  Diagnosis Date   Elevated liver function tests    Fatty liver    Hypertension    Kidney stone    Morbid obesity (HCC)    PONV (postoperative nausea and vomiting)    Prediabetes    Sinus tachycardia    Sleep apnea     Surgical History:  Past Surgical History:  Procedure Laterality Date   CHOLECYSTECTOMY     EXTRACORPOREAL SHOCK WAVE LITHOTRIPSY Right 07/27/2019   Procedure: EXTRACORPOREAL SHOCK WAVE LITHOTRIPSY (ESWL);  Surgeon: Renda Glance, MD;  Location: [facility];  Service: Urology;  Laterality: Right;    Medications:  Current Outpatient Medications on File Prior to Visit  Medication Sig   albuterol  (VENTOLIN  HFA) 108 (90 Base) MCG/ACT inhaler Inhale 1-2 puffs into the lungs every 6 (six) hours as needed for wheezing or shortness of breath.   ALPRAZolam  (XANAX ) 0.5 MG tablet Take 1 tablet (  0.5 mg total) by mouth 2 (two) times daily as needed for anxiety.   amphetamine-dextroamphetamine (ADDERALL XR) 15 MG 24 hr capsule Take 15 mg by mouth every morning.   aspirin 325 MG tablet Take 325 mg by mouth daily.   buPROPion  (WELLBUTRIN  XL) 300 MG 24 hr tablet Take 1 tablet (300 mg total) by mouth daily.   cloNIDine  (CATAPRES ) 0.1 MG tablet Take 1 tablet (0.1 mg total) by mouth at bedtime.   Clotrimazole  1 % OINT Apply 1 application. topically daily.   DULoxetine  (CYMBALTA ) 30 MG capsule TAKE ONE CAPSULE EVERY DAY FOR 4 WEEKS, MAY INCREASE TO 2 DAILY AFTER 4 WEEKS   lamoTRIgine  (LAMICTAL ) 150 MG tablet Take one tab daily   lurasidone (LATUDA) 40 MG TABS tablet Take 40 mg by mouth daily with breakfast.   meloxicam  (MOBIC ) 15 MG tablet Take 1 tablet (15 mg total) by mouth daily as needed.   metFORMIN  (GLUCOPHAGE -XR) 500 MG 24 hr tablet Take 1 tablet (500 mg total) by mouth daily with breakfast.   metoprolol  succinate (TOPROL -XL) 100 MG 24 hr tablet Take 1 tablet by mouth daily. Take with or immediately following a meal.   Multiple Vitamin (MULTIVITAMIN  ADULT PO) Take by mouth.   Omega-3 Fatty Acids (FISH OIL OMEGA-3 PO) Take by mouth.   omeprazole  (PRILOSEC) 20 MG capsule TAKE ONE CAPSULE BY MOUTH DAILY   OZEMPIC , 2 MG/DOSE, 8 MG/3ML SOPN Inject 2 mg as directed once a week.   promethazine -dextromethorphan (PROMETHAZINE -DM) 6.25-15 MG/5ML syrup Take 5 mLs by mouth at bedtime as needed for cough.   tamsulosin  (FLOMAX ) 0.4 MG CAPS capsule TAKE ONE CAPSULE BY MOUTH DAILY   traZODone  (DESYREL ) 100 MG tablet Take 1 tablet (100 mg total) by mouth at bedtime.   valsartan  (DIOVAN ) 160 MG tablet Take 1 tablet (160 mg total) by mouth daily.   No current facility-administered medications on file prior to visit.    Allergies:  No Known Allergies  Social History:  Social History   Socioeconomic History   Marital status: Married    Spouse name: Not on file   Number of children: Not on file   Years of education: Not on file   Highest education level: Associate degree: occupational, Scientist, product/process development, or vocational program  Occupational History   Not on file  Tobacco Use   Smoking status: Former    Current packs/day: 0.00    Average packs/day: 0.5 packs/day for 7.0 years (3.5 ttl pk-yrs)    Types: Cigarettes    Start date: 2004    Quit date: 2011    Years since quitting: 14.4   Smokeless tobacco: Current    Types: Chew  Vaping Use   Vaping status: Never Used  Substance and Sexual Activity   Alcohol use: Yes    Alcohol/week: 49.0 standard drinks of alcohol    Types: 49 Shots of liquor per week   Drug use: Not Currently   Sexual activity: Yes  Other Topics Concern   Not on file  Social History Narrative   Not on file   Social Drivers of Health   Financial Resource Strain: Medium Risk (07/22/2023)   Overall Financial Resource Strain (CARDIA)    Difficulty of Paying Living Expenses: Somewhat hard  Food Insecurity: No Food Insecurity (07/22/2023)   Hunger Vital Sign    Worried About Running Out of Food in the Last Year: Never true    Ran  Out of Food in the Last Year: Never true  Transportation Needs: No Transportation  Needs (07/22/2023)   PRAPARE - Administrator, Civil Service (Medical): No    Lack of Transportation (Non-Medical): No  Physical Activity: Insufficiently Active (07/22/2023)   Exercise Vital Sign    Days of Exercise per Week: 1 day    Minutes of Exercise per Session: 10 min  Stress: No Stress Concern Present (07/22/2023)   [employer] of Occupational Health - Occupational Stress Questionnaire    Feeling of Stress: Only a little  Social Connections: Moderately Integrated (07/22/2023)   Social Connection and Isolation Panel    Frequency of Communication with Friends and Family: Twice a week    Frequency of Social Gatherings with Friends and Family: Once a week    Attends Religious Services: More than 4 times per year    Active Member of [employer] or Organizations: No    Attends Engineer, structural: Not on file    Marital Status: Married  Catering manager Violence: Not on file   Social History   Tobacco Use  Smoking Status Former   Current packs/day: 0.00   Average packs/day: 0.5 packs/day for 7.0 years (3.5 ttl pk-yrs)   Types: Cigarettes   Start date: 2004   Quit date: 2011   Years since quitting: 14.4  Smokeless Tobacco Current   Types: Chew   Social History   Substance and Sexual Activity  Alcohol Use Yes   Alcohol/week: 49.0 standard drinks of alcohol   Types: 49 Shots of liquor per week    Family History:  Family History  Problem Relation Age of Onset   Kidney disease Mother    Diabetes Mother    Liver disease Mother    Stroke Father    Diabetes Father    Arthritis Father    Heart disease Father    Hypertension Father    Hyperlipidemia Father    Heart attack Father    GI Bleed Father    Colon cancer Maternal Grandfather    Heart disease Maternal Grandfather    Hypertension Maternal Grandfather    Hyperlipidemia Maternal Grandfather    Heart attack  Maternal Grandfather     Past medical history, surgical history, medications, allergies, family history and social history reviewed with patient today and changes made to appropriate areas of the chart.   Review of Systems  Constitutional:  Positive for malaise/fatigue. Negative for fever.  HENT: Negative.    Eyes: Negative.   Respiratory: Negative.    Cardiovascular: Negative.   Gastrointestinal: Negative.   Genitourinary: Negative.   Musculoskeletal: Negative.   Skin: Negative.   Neurological: Negative.   Psychiatric/Behavioral: Negative.     All other ROS negative except what is listed above and in the HPI.      Objective:    BP 126/84 (BP Location: Left Arm, Cuff Size: Large)   Pulse 84   Temp (!) 96.8 F (36 C)   Ht 6' 4 (1.93 m)   Wt (!) 383 lb 9.6 oz (174 kg)   SpO2 95%   BMI 46.69 kg/m   Wt Readings from Last 3 Encounters:  07/22/23 (!) 383 lb 9.6 oz (174 kg)  04/19/23 (!) 404 lb 3.2 oz (183.3 kg)  01/14/23 (!) 396 lb 3.2 oz (179.7 kg)    Physical Exam Vitals and nursing note reviewed.  Constitutional:      General: He is not in acute distress.    Appearance: Normal appearance. He is obese.  HENT:     Head: Normocephalic and atraumatic.  Right Ear: Tympanic membrane, ear canal and external ear normal.     Left Ear: Tympanic membrane, ear canal and external ear normal.     Mouth/Throat:     Mouth: Mucous membranes are moist.     Pharynx: No posterior oropharyngeal erythema.   Eyes:     Conjunctiva/sclera: Conjunctivae normal.    Cardiovascular:     Rate and Rhythm: Normal rate and regular rhythm.     Pulses: Normal pulses.     Heart sounds: Normal heart sounds.  Pulmonary:     Effort: Pulmonary effort is normal.     Breath sounds: Normal breath sounds.  Abdominal:     Palpations: Abdomen is soft.     Tenderness: There is no abdominal tenderness.   Musculoskeletal:        General: Normal range of motion.     Cervical back: Normal range of  motion and neck supple. No tenderness.     Right lower leg: No edema.     Left lower leg: No edema.  Lymphadenopathy:     Cervical: No cervical adenopathy.   Skin:    General: Skin is warm and dry.   Neurological:     General: No focal deficit present.     Mental Status: He is alert and oriented to person, place, and time.     Cranial Nerves: No cranial nerve deficit.     Coordination: Coordination normal.     Gait: Gait normal.   Psychiatric:        Mood and Affect: Mood normal.        Behavior: Behavior normal.        Thought Content: Thought content normal.        Judgment: Judgment normal.     Results for orders placed or performed in visit on 05/10/23  Prolactin   Collection Time: 05/10/23  8:27 AM  Result Value Ref Range   Prolactin 83.4 (H) 2.0 - 18.0 ng/mL  FSH/LH   Collection Time: 05/10/23  8:27 AM  Result Value Ref Range   FSH 2.4 1.4 - 12.8 mIU/mL   LH 1.4 (L) 1.5 - 9.3 mIU/mL  Testosterone ,Free and Total   Collection Time: 05/10/23  8:27 AM  Result Value Ref Range   Testosterone  142 (L) 264 - 916 ng/dL   Testosterone , Free 3.0 (L) 6.8 - 21.5 pg/mL      Assessment & Plan:   Problem List Items Addressed This Visit       Cardiovascular and Mediastinum   Hypertension   Chronic, stable. Continue valsartan  160mg  daily and Toprol  XL 100mg  daily. Check CMP, CBC today.       Relevant Medications   tadalafil  (CIALIS ) 20 MG tablet     Respiratory   Sleep apnea   Chronic, stable. Encouraged him to use his CPAP regularly.        Endocrine   Diabetes mellitus without complication (HCC)   Chronic, stable. Check A1c, CMP, lipid panel today. Continue metformin  XR 500 and ozempic  2mg  weekly. Up to date on eye exam. Follow-up in 3 months.       Relevant Orders   CBC with Differential/Platelet   Microalbumin / creatinine urine ratio   Comprehensive metabolic panel with GFR   Hemoglobin A1c   Lipid panel     Other   Current moderate episode of major  depressive disorder without prior episode (HCC)   Chronic, stable. He denies SI/HI.  He is following with psychiatry and is taking lamictal  150mg   daily, latuda 40mg  daily, wellbutrin  300mg  daily, cymbalta  30mg  daily, and xanax  as needed. Continue collaboration and recommendations from psychiatry.       Anxiety   Chronic, ongoing. He is still following with psychiatry and a therapist. Continue latuda 40mg  daily, Wellbturin XL 300mg  daily, cymbalta  30mg  daily, and lamictal  150mg  daily.  Continue collaboration and recommendations from psychiatry.       Erectile dysfunction   Chronic, ongoing. Continue cialis  10-20mg  daily as needed.       Morbid obesity (HCC)   BMI 46.6. He has lost 21 pounds in the last 3 months. Congratulated him on this. Continue focus on nutrition and start to add exercise.       Routine general medical examination at a health care facility - Primary   Health maintenance reviewed and updated. Discussed nutrition, exercise. Follow-up 1 year.        Long term current use of oral hypoglycemic drug   Continue metformin  XR 500mg  daily.       Long-term current use of injectable noninsulin antidiabetic medication   Continue ozempic  2mg  weekly      Other Visit Diagnoses       Immunization due       Prevnar 20 given today   Relevant Orders   Pneumococcal conjugate vaccine 20-valent       IMMUNIZATIONS:   - Tdap: Tetanus vaccination status reviewed: last tetanus booster within 10 years. - Influenza: Postponed to flu season - Pneumovax: Not applicable - Prevnar: Administered today - HPV: Not applicable - Shingrix vaccine: Not applicable  SCREENING: - Colonoscopy: Not applicable  Discussed with patient purpose of the colonoscopy is to detect colon cancer at curable precancerous or early stages   - AAA Screening: Not applicable   PATIENT COUNSELING:    Sexuality: Discussed sexually transmitted diseases, partner selection, use of condoms, avoidance of  unintended pregnancy  and contraceptive alternatives.   Advised to avoid cigarette smoking.  I discussed with the patient that most people either abstain from alcohol or drink within safe limits (<=14/week and <=4 drinks/occasion for males, <=7/weeks and <= 3 drinks/occasion for females) and that the risk for alcohol disorders and other health effects rises proportionally with the number of drinks per week and how often a drinker exceeds daily limits.  Discussed cessation/primary prevention of drug use and availability of treatment for abuse.   Diet: Encouraged to adjust caloric intake to maintain  or achieve ideal body weight, to reduce intake of dietary saturated fat and total fat, to limit sodium intake by avoiding high sodium foods and not adding table salt, and to maintain adequate dietary potassium and calcium preferably from fresh fruits, vegetables, and low-fat dairy products.    stressed the importance of regular exercise  Injury prevention: Discussed safety belts, safety helmets, smoke detector, smoking near bedding or upholstery.   Dental health: Discussed importance of regular tooth brushing, flossing, and dental visits.   Follow up plan: NEXT PREVENTATIVE PHYSICAL DUE IN 1 YEAR. Return in about 3 months (around 10/22/2023) for Diabetes.  Irie Dowson A Liberti Appleton

## 2023-07-22 NOTE — Patient Instructions (Signed)
It was great to see you!  We are checking your labs today and will let you know the results via mychart/phone.   Let's follow-up in 3 months, sooner if you have concerns.  If a referral was placed today, you will be contacted for an appointment. Please note that routine referrals can sometimes take up to 3-4 weeks to process. Please call our office if you haven't heard anything after this time frame.  Take care,  Nanako Stopher, NP  

## 2023-07-22 NOTE — Telephone Encounter (Signed)
 Received a fax from [facility] regarding Rx of Tadalafil . Spoke with pharmacy tech and patient will pay out of pocket.

## 2023-07-22 NOTE — Assessment & Plan Note (Signed)
 Chronic, stable. Continue valsartan  160mg  daily and Toprol  XL 100mg  daily. Check CMP, CBC today.

## 2023-07-22 NOTE — Assessment & Plan Note (Signed)
 Continue ozempic 2mg  weekly.

## 2023-07-22 NOTE — Assessment & Plan Note (Signed)
 Chronic, stable. Encouraged him to use his CPAP regularly.

## 2023-07-22 NOTE — Assessment & Plan Note (Signed)
 Chronic, ongoing. He is still following with psychiatry and a therapist. Continue latuda 40mg  daily, Wellbturin XL 300mg  daily, cymbalta  30mg  daily, and lamictal  150mg  daily.  Continue collaboration and recommendations from psychiatry.

## 2023-07-22 NOTE — Assessment & Plan Note (Signed)
Continue metformin XR 500 mg daily.

## 2023-07-22 NOTE — Assessment & Plan Note (Signed)
 BMI 46.6. He has lost 21 pounds in the last 3 months. Congratulated him on this. Continue focus on nutrition and start to add exercise.

## 2023-07-22 NOTE — Assessment & Plan Note (Signed)
 Chronic, ongoing. Continue cialis  10-20mg  daily as needed.

## 2023-07-22 NOTE — Assessment & Plan Note (Signed)
 Chronic, stable. He denies SI/HI.  He is following with psychiatry and is taking lamictal  150mg  daily, latuda 40mg  daily, wellbutrin  300mg  daily, cymbalta  30mg  daily, and xanax  as needed. Continue collaboration and recommendations from psychiatry.

## 2023-07-22 NOTE — Assessment & Plan Note (Signed)
Health maintenance reviewed and updated. Discussed nutrition, exercise. Follow-up 1 year.

## 2023-07-25 ENCOUNTER — Other Ambulatory Visit (HOSPITAL_COMMUNITY)
Admission: RE | Admit: 2023-07-25 | Discharge: 2023-07-25 | Disposition: A | Discharge status: Home / Self Care | Payer: Self-pay | Source: Ambulatory Visit | Attending: Medical Genetics | Admitting: Medical Genetics

## 2023-07-28 ENCOUNTER — Ambulatory Visit
Admission: RE | Admit: 2023-07-28 | Discharge: 2023-07-28 | Disposition: A | Discharge status: Home / Self Care | Payer: MEDICAID | Source: Ambulatory Visit | Attending: Nurse Practitioner | Admitting: Nurse Practitioner

## 2023-07-28 DIAGNOSIS — E221 Hyperprolactinemia: Secondary | ICD-10-CM | POA: Diagnosis not present

## 2023-07-28 DIAGNOSIS — R7989 Other specified abnormal findings of blood chemistry: Secondary | ICD-10-CM

## 2023-07-28 MED ORDER — GADOPICLENOL 0.5 MMOL/ML IV SOLN
10.0000 mL | Freq: Once | INTRAVENOUS | Status: AC | PRN
  Administered 2023-07-28: 10 mL via INTRAVENOUS

## 2023-08-04 LAB — GENECONNECT MOLECULAR SCREEN: Genetic Analysis Overall Interpretation: NEGATIVE

## 2023-08-05 ENCOUNTER — Ambulatory Visit: Payer: Self-pay | Admitting: Nurse Practitioner

## 2023-08-10 DIAGNOSIS — Z419 Encounter for procedure for purposes other than remedying health state, unspecified: Secondary | ICD-10-CM | POA: Diagnosis not present

## 2023-08-16 DIAGNOSIS — F840 Autistic disorder: Secondary | ICD-10-CM | POA: Diagnosis not present

## 2023-08-16 DIAGNOSIS — F900 Attention-deficit hyperactivity disorder, predominantly inattentive type: Secondary | ICD-10-CM | POA: Diagnosis not present

## 2023-08-16 DIAGNOSIS — F411 Generalized anxiety disorder: Secondary | ICD-10-CM | POA: Diagnosis not present

## 2023-08-16 DIAGNOSIS — F331 Major depressive disorder, recurrent, moderate: Secondary | ICD-10-CM | POA: Diagnosis not present

## 2023-08-28 ENCOUNTER — Other Ambulatory Visit: Payer: Self-pay | Admitting: Nurse Practitioner

## 2023-08-28 NOTE — Telephone Encounter (Signed)
 Requesting: meloxicam  15 mg tablet  Last Visit: 07/22/2023 Next Visit: 10/22/2023 Last Refill: 04/30/2023  Please Advise

## 2023-08-30 DIAGNOSIS — F840 Autistic disorder: Secondary | ICD-10-CM | POA: Diagnosis not present

## 2023-08-30 DIAGNOSIS — F331 Major depressive disorder, recurrent, moderate: Secondary | ICD-10-CM | POA: Diagnosis not present

## 2023-08-30 DIAGNOSIS — F411 Generalized anxiety disorder: Secondary | ICD-10-CM | POA: Diagnosis not present

## 2023-08-30 DIAGNOSIS — F900 Attention-deficit hyperactivity disorder, predominantly inattentive type: Secondary | ICD-10-CM | POA: Diagnosis not present

## 2023-09-02 DIAGNOSIS — F331 Major depressive disorder, recurrent, moderate: Secondary | ICD-10-CM | POA: Diagnosis not present

## 2023-09-02 DIAGNOSIS — F900 Attention-deficit hyperactivity disorder, predominantly inattentive type: Secondary | ICD-10-CM | POA: Diagnosis not present

## 2023-09-02 DIAGNOSIS — F411 Generalized anxiety disorder: Secondary | ICD-10-CM | POA: Diagnosis not present

## 2023-09-04 ENCOUNTER — Encounter: Payer: Self-pay | Admitting: Gastroenterology

## 2023-09-04 ENCOUNTER — Ambulatory Visit (INDEPENDENT_AMBULATORY_CARE_PROVIDER_SITE_OTHER): Admitting: Gastroenterology

## 2023-09-04 ENCOUNTER — Other Ambulatory Visit (INDEPENDENT_AMBULATORY_CARE_PROVIDER_SITE_OTHER)

## 2023-09-04 VITALS — BP 100/80 | HR 76 | Ht 74.0 in | Wt 384.0 lb

## 2023-09-04 DIAGNOSIS — R748 Abnormal levels of other serum enzymes: Secondary | ICD-10-CM

## 2023-09-04 DIAGNOSIS — Z8379 Family history of other diseases of the digestive system: Secondary | ICD-10-CM

## 2023-09-04 DIAGNOSIS — R7989 Other specified abnormal findings of blood chemistry: Secondary | ICD-10-CM | POA: Diagnosis not present

## 2023-09-04 DIAGNOSIS — K760 Fatty (change of) liver, not elsewhere classified: Secondary | ICD-10-CM

## 2023-09-04 LAB — PROTIME-INR
INR: 1.1 ratio — ABNORMAL HIGH (ref 0.8–1.0)
Prothrombin Time: 11.7 s (ref 9.6–13.1)

## 2023-09-04 LAB — CBC WITH DIFFERENTIAL/PLATELET
Basophils Absolute: 0.1 K/uL (ref 0.0–0.1)
Basophils Relative: 0.8 % (ref 0.0–3.0)
Eosinophils Absolute: 0.5 K/uL (ref 0.0–0.7)
Eosinophils Relative: 6.7 % — ABNORMAL HIGH (ref 0.0–5.0)
HCT: 43.7 % (ref 39.0–52.0)
Hemoglobin: 13.9 g/dL (ref 13.0–17.0)
Lymphocytes Relative: 32.5 % (ref 12.0–46.0)
Lymphs Abs: 2.4 K/uL (ref 0.7–4.0)
MCHC: 31.8 g/dL (ref 30.0–36.0)
MCV: 81.3 fl (ref 78.0–100.0)
Monocytes Absolute: 0.7 K/uL (ref 0.1–1.0)
Monocytes Relative: 9.5 % (ref 3.0–12.0)
Neutro Abs: 3.7 K/uL (ref 1.4–7.7)
Neutrophils Relative %: 50.5 % (ref 43.0–77.0)
Platelets: 203 K/uL (ref 150.0–400.0)
RBC: 5.37 Mil/uL (ref 4.22–5.81)
RDW: 15.2 % (ref 11.5–15.5)
WBC: 7.3 K/uL (ref 4.0–10.5)

## 2023-09-04 LAB — COMPREHENSIVE METABOLIC PANEL WITH GFR
ALT: 53 U/L (ref 0–53)
AST: 47 U/L — ABNORMAL HIGH (ref 0–37)
Albumin: 3.9 g/dL (ref 3.5–5.2)
Alkaline Phosphatase: 38 U/L — ABNORMAL LOW (ref 39–117)
BUN: 12 mg/dL (ref 6–23)
CO2: 28 meq/L (ref 19–32)
Calcium: 9.5 mg/dL (ref 8.4–10.5)
Chloride: 107 meq/L (ref 96–112)
Creatinine, Ser: 0.88 mg/dL (ref 0.40–1.50)
GFR: 105.08 mL/min (ref >60.00)
Glucose, Bld: 93 mg/dL (ref 70–99)
Potassium: 4.2 meq/L (ref 3.5–5.1)
Sodium: 141 meq/L (ref 135–145)
Total Bilirubin: 0.3 mg/dL (ref 0.2–1.2)
Total Protein: 7.1 g/dL (ref 6.0–8.3)

## 2023-09-04 LAB — IBC + FERRITIN
Ferritin: 47.4 ng/mL (ref 22.0–322.0)
Iron: 35 ug/dL — ABNORMAL LOW (ref 42–165)
Saturation Ratios: 9.4 % — ABNORMAL LOW (ref 20.0–50.0)
TIBC: 372.4 ug/dL (ref 250.0–450.0)
Transferrin: 266 mg/dL (ref 212.0–360.0)

## 2023-09-04 LAB — TSH: TSH: 1.04 u[IU]/mL (ref 0.35–5.50)

## 2023-09-04 NOTE — Patient Instructions (Signed)
 Your provider has requested that you go to the basement level for lab work before leaving today. Press B on the elevator. The lab is located at the first door on the left as you exit the elevator.  You have been scheduled for an abdominal ultrasound at [facility] (1st floor of hospital) on 09/11/23 at 8:00 am. Please arrive 30 minutes prior to your appointment for registration. Make certain not to have anything to eat or drink 6 hours prior to your appointment. Should you need to reschedule your appointment, please contact radiology at [phone]. This test typically takes about 30 minutes to perform.  Please contact our office in a couple of weeks to schedule a follow up visit for November with Camie. _______________________________________________________  If your blood pressure at your visit was 140/90 or greater, please contact your primary care physician to follow up on this.  _______________________________________________________  If you are age 58 or older, your body mass index should be between 23-30. Your Body mass index is 49.3 kg/m. If this is out of the aforementioned range listed, please consider follow up with your Primary Care Provider.  If you are age 33 or younger, your body mass index should be between 19-25. Your Body mass index is 49.3 kg/m. If this is out of the aformentioned range listed, please consider follow up with your Primary Care Provider.   ________________________________________________________  The [facility] providers would like to encourage you to use MYCHART to communicate with providers for non-urgent requests or questions.  Due to long hold times on the telephone, sending your provider a message by [facility] may be a faster and more efficient way to get a response.  Please allow 48 business hours for a response.  Please remember that this is for non-urgent requests.  _______________________________________________________  Peter Gutierrez  Gastroenterology is using a team-based approach to care.  Your team is made up of your doctor and two to three APPS. Our APPS (Nurse Practitioners and Physician Assistants) work with your physician to ensure care continuity for you. They are fully qualified to address your health concerns and develop a treatment plan. They communicate directly with your gastroenterologist to care for you. Seeing the Advanced Practice Practitioners on your physician's team can help you by facilitating care more promptly, often allowing for earlier appointments, access to diagnostic testing, procedures, and other specialty referrals.

## 2023-09-04 NOTE — Progress Notes (Signed)
 HORTON ELLITHORPE [id number] [birth year]   Chief Complaint: Elevated liver enzymes  Referring Provider: Nedra Tinnie LABOR, NP Primary GI MD: Sampson  HPI: SELIM DURDEN is a 43 y.o. male with past medical history of fatty liver, elevated liver enzymes, HTN, obesity, kidney stones, T2DM, sleep apnea, cholecystectomy who presents today for a complaint of elevated liver enzymes.    Has had elevation in AST and ALT since 2022.  Labs 07/22/2023: AST 62, ALT 63, alk phos 43, total bilirubin 0.4, albumin 3.9, normal CBC, hemoglobin A1c 6.3, high triglycerides   Patient denies concerns today.  Denies upper GI symptoms including dysphagia, nausea, vomiting.  He reports acid reflux is controlled on Prilosec.  Denies abdominal pain or jaundice.  Reports regular bowel movements and denies diarrhea, constipation, blood in his stool, or melena.  He is on Ozempic  for management of diabetes.  Denies any side effects from the medication.  Reports that his mother has history of cirrhosis of the liver and had a liver transplant, doing well.  He is unsure exactly what caused her cirrhosis, but mentions possible copper accumulation.  Previous GI Procedures/Imaging   RUQ US  09/26/2021 - Increased hepatic parenchymal echogenicity suggestive of steatosis. - Prior cholecystectomy  Past Medical History:  Diagnosis Date   Depression    DM (diabetes mellitus) (HCC)    Elevated liver function tests    Fatty liver    Gallstones    Hypertension    Kidney stone    Morbid obesity (HCC)    PONV (postoperative nausea and vomiting)    Prediabetes    Sinus tachycardia    Sleep apnea    CPAP    Past Surgical History:  Procedure Laterality Date   CHOLECYSTECTOMY     EXTRACORPOREAL SHOCK WAVE LITHOTRIPSY Right 07/27/2019   Procedure: EXTRACORPOREAL SHOCK WAVE LITHOTRIPSY (ESWL);  Surgeon: Renda Glance, MD;  Location: [facility];  Service: Urology;  Laterality: Right;   SMALL INTESTINE  SURGERY     about age 44 months or 3 weeks    Current Outpatient Medications  Medication Sig Dispense Refill   albuterol  (VENTOLIN  HFA) 108 (90 Base) MCG/ACT inhaler Inhale 1-2 puffs into the lungs every 6 (six) hours as needed for wheezing or shortness of breath. 8 g 0   ALPRAZolam  (XANAX ) 0.5 MG tablet Take 1 tablet (0.5 mg total) by mouth 2 (two) times daily as needed for anxiety. 60 tablet 0   amphetamine-dextroamphetamine (ADDERALL XR) 25 MG 24 hr capsule Take 25 mg by mouth every morning.     aspirin 325 MG tablet Take 325 mg by mouth daily.     buPROPion  (WELLBUTRIN  XL) 300 MG 24 hr tablet Take 1 tablet (300 mg total) by mouth daily. 90 tablet 0   cloNIDine  (CATAPRES ) 0.1 MG tablet Take 1 tablet (0.1 mg total) by mouth at bedtime. 90 tablet 1   Clotrimazole  1 % OINT Apply 1 application. topically daily. 56.7 g 2   DULoxetine  (CYMBALTA ) 60 MG capsule Take 60 mg by mouth at bedtime.     lamoTRIgine  (LAMICTAL ) 150 MG tablet Take one tab daily 30 tablet 0   lurasidone (LATUDA) 40 MG TABS tablet Take 40 mg by mouth daily with breakfast.     meloxicam  (MOBIC ) 15 MG tablet Take 1 tablet (15 mg total) by mouth daily as needed. 90 tablet 0   metFORMIN  (GLUCOPHAGE -XR) 500 MG 24 hr tablet Take 1 tablet (500 mg total) by mouth daily with breakfast. 90 tablet 1  metoprolol  succinate (TOPROL -XL) 100 MG 24 hr tablet Take 1 tablet by mouth daily. Take with or immediately following a meal. 90 tablet 2   Multiple Vitamin (MULTIVITAMIN ADULT PO) Take by mouth.     Omega-3 Fatty Acids (FISH OIL OMEGA-3 PO) Take by mouth.     omeprazole  (PRILOSEC) 20 MG capsule TAKE ONE CAPSULE BY MOUTH DAILY 60 capsule 3   OZEMPIC , 2 MG/DOSE, 8 MG/3ML SOPN Inject 2 mg as directed once a week. 3 mL 2   promethazine -dextromethorphan (PROMETHAZINE -DM) 6.25-15 MG/5ML syrup Take 5 mLs by mouth at bedtime as needed for cough. 118 mL 0   tadalafil  (CIALIS ) 20 MG tablet Take 0.5-1 tablets (10-20 mg total) by mouth every other day  as needed for erectile dysfunction. 10 tablet 11   tamsulosin  (FLOMAX ) 0.4 MG CAPS capsule TAKE ONE CAPSULE BY MOUTH DAILY 90 capsule 2   traZODone  (DESYREL ) 100 MG tablet Take 1 tablet (100 mg total) by mouth at bedtime. 90 tablet 0   valsartan  (DIOVAN ) 160 MG tablet Take 1 tablet (160 mg total) by mouth daily. 60 tablet 2   No current facility-administered medications for this visit.    Allergies as of 09/04/2023   (No Known Allergies)    Family History  Problem Relation Age of Onset   Kidney disease Mother    Diabetes Mother    Liver disease Mother    Stroke Father    Diabetes Father    Arthritis Father    Heart disease Father    Hypertension Father    Hyperlipidemia Father    Heart attack Father    GI Bleed Father    Hypertension Brother    Hyperlipidemia Brother    Colon cancer Maternal Grandfather    Heart disease Maternal Grandfather    Hypertension Maternal Grandfather    Hyperlipidemia Maternal Grandfather    Heart attack Maternal Grandfather    Diverticulitis Maternal Grandfather    Emphysema Paternal Grandmother    Autism Daughter    Anxiety disorder Daughter    Depression Daughter    Autism Daughter    Anxiety disorder Daughter    Depression Daughter    Anxiety disorder Daughter    Depression Daughter     Social History   Tobacco Use   Smoking status: Former    Current packs/day: 0.00    Average packs/day: 0.5 packs/day for 7.0 years (3.5 ttl pk-yrs)    Types: Cigarettes    Start date: 2004    Quit date: 2011    Years since quitting: 14.6   Smokeless tobacco: Current    Types: Chew  Vaping Use   Vaping status: Never Used  Substance Use Topics   Alcohol use: Not Currently    Alcohol/week: 49.0 standard drinks of alcohol    Types: 49 Shots of liquor per week    Comment: quit in 2024   Drug use: Not Currently     Review of Systems:    Constitutional: No unexplained weight loss, fever, chills, weakness or fatigue Skin: No  jaundice Cardiovascular: No chest pain Respiratory: No SOB  Gastrointestinal: See HPI and otherwise negative Hematologic: No bleeding     Physical Exam:  Vital signs: BP 100/80 (BP Location: Left Arm, Patient Position: Sitting, Cuff Size: Large)   Pulse 76   Ht 6' 2 (1.88 m) Comment: height measured without shoes  Wt (!) 384 lb (174.2 kg)   BMI 49.30 kg/m   Constitutional: Morbidly obese male in NAD, alert and cooperative Head:  Normocephalic and atraumatic.  Eyes: No scleral icterus.  Respiratory: Respirations even and unlabored. Lungs clear to auscultation bilaterally.  No wheezes, crackles, or rhonchi.  Cardiovascular:  Regular rate and rhythm. No murmurs. No peripheral edema. Gastrointestinal:  Soft, nondistended, nontender. No rebound or guarding. Normal bowel sounds. No appreciable masses or hepatomegaly. Rectal:  Not performed.  Neurologic:  Alert and oriented x4;  grossly normal neurologically.  Skin:   Dry and intact without significant lesions or rashes. Psychiatric: Oriented to person, place and time. Demonstrates good judgement and reason without abnormal affect or behaviors.   RELEVANT LABS AND IMAGING: CBC    Component Value Date/Time   WBC 7.0 07/22/2023 0922   RBC 5.22 07/22/2023 0922   HGB 13.7 07/22/2023 0922   HCT 42.5 07/22/2023 0922   PLT 180.0 07/22/2023 0922   MCV 81.5 07/22/2023 0922   MCHC 32.1 07/22/2023 0922   RDW 14.9 07/22/2023 0922   LYMPHSABS 2.0 07/22/2023 0922   MONOABS 0.6 07/22/2023 0922   EOSABS 0.4 07/22/2023 0922   BASOSABS 0.0 07/22/2023 0922    CMP     Component Value Date/Time   NA 142 07/22/2023 0922   NA 140 01/17/2021 0000   K 4.3 07/22/2023 0922   CL 108 07/22/2023 0922   CO2 25 07/22/2023 0922   GLUCOSE 113 (H) 07/22/2023 0922   BUN 12 07/22/2023 0922   BUN 13 01/17/2021 0000   CREATININE 1.05 07/22/2023 0922   CALCIUM 9.3 07/22/2023 0922   PROT 6.8 07/22/2023 0922   ALBUMIN 3.9 07/22/2023 0922   AST 62 (H)  07/22/2023 0922   ALT 63 (H) 07/22/2023 0922   ALKPHOS 43 07/22/2023 0922   BILITOT 0.4 07/22/2023 0922   Echocardiogram 10/02/2018 1. The left ventricle has normal systolic function with an ejection fraction of 60- 65% . The cavity size was normal. Left ventricular diastolic Doppler parameters are consistent with pseudonormalization.  2. The right ventricle has normal systolic function. The cavity was normal. There is no increase in right ventricular wall thickness.  3. The mitral valve is grossly normal.  4. The tricuspid valve is grossly normal.  5. The aorta is normal unless otherwise noted.  6. There is mild dilatation of the ascending aorta measuring 38 mm.    Fibrosis 4 Score = 1.87 (Indeterminate)       Interpretation for patients with NAFLD          <1.30       -  F0-F1 (Low risk)          1.30-2.67 -  Indeterminate           >2.67      -  F3-F4 (High risk)     Validated for ages 59-65  Assessment/Plan:   Elevated liver enzymes Hepatic steatosis Family history of liver disease Patient seen today for further workup of elevated liver enzymes and fatty liver.  Hepatic steatosis seen on ultrasound 2023.  Patient has multiple risk factors for metabolic associated liver disease.  Will update a right upper quadrant ultrasound and pursue serologic evaluation to rule out other causes of liver disease.  Notably he reports that his mother had cirrhosis of the liver and is s/p liver transplant, possibly due to copper accumulation though he is unsure about this.   He is now on Ozempic  for management of diabetes.  - Labs today: CBC, CMP, PT/INR, TSH, HepCAb, HepBsAg, HepBsAb, HepBcAb, HAV, iron/ferritin, IgG, ANA, ASMA, AMA, alpha-1 antitrypsin level, ceruloplasmin - Order RUQ  ultrasound - Abstain from all alcohol including beer, wine, liquor, and non-alcoholic beer.  - Work to maintain a healthy weight through portion control and exercise  - Maximize control of any hyperglycemia and  hyperlipidemia  - Follow-up 3 months   Camie Furbish, PA-C  Gastroenterology 09/04/2023, 2:02 PM  Patient Care Team: Nedra Tinnie LABOR, NP as PCP - General (Internal Medicine)

## 2023-09-05 ENCOUNTER — Other Ambulatory Visit: Payer: Self-pay

## 2023-09-05 ENCOUNTER — Telehealth: Payer: Self-pay | Admitting: Gastroenterology

## 2023-09-05 DIAGNOSIS — K760 Fatty (change of) liver, not elsewhere classified: Secondary | ICD-10-CM

## 2023-09-05 DIAGNOSIS — Z8379 Family history of other diseases of the digestive system: Secondary | ICD-10-CM

## 2023-09-05 DIAGNOSIS — R748 Abnormal levels of other serum enzymes: Secondary | ICD-10-CM

## 2023-09-05 NOTE — Telephone Encounter (Signed)
 Dr. Wilhelmenia has advised liver elastography for this patient. Please see if we can add this on to his upcoming RUQ US , thank you

## 2023-09-05 NOTE — Progress Notes (Signed)
 Attending Physician's Attestation   I have reviewed the chart.   I agree with the Advanced Practitioner's note, impression, and recommendations with any updates as below. Workup as outlined is important and reasonable.  With family history and indeterminate fib 4, I think we need to get further imaging.  Agree with right upper quadrant ultrasound but would add an elastography to that as well.   Aloha Finner, MD  Gastroenterology Advanced [facility] Office # [phone]

## 2023-09-05 NOTE — Addendum Note (Signed)
 Addended by: MADAN, Sheikh Leverich L on: 09/05/2023 09:52 AM   Modules accepted: Orders

## 2023-09-05 NOTE — Telephone Encounter (Signed)
 Spoke with Taryn at central scheduling for the radiology department and informed her we need to change the RUQ ultrasound to add elastography. Taryn said to add the new order and she will link the appt to the new order. Order added and old order canceled.

## 2023-09-07 LAB — ANTI-NUCLEAR AB-TITER (ANA TITER): ANA Titer 1: 1:40 {titer} — ABNORMAL HIGH

## 2023-09-07 LAB — IGG: IgG (Immunoglobin G), Serum: 863 mg/dL (ref 600–1640)

## 2023-09-07 LAB — HEPATITIS B SURFACE ANTIBODY,QUALITATIVE: Hep B S Ab: NONREACTIVE

## 2023-09-07 LAB — CERULOPLASMIN: Ceruloplasmin: 27 mg/dL (ref 14–30)

## 2023-09-07 LAB — ANTI-SMOOTH MUSCLE ANTIBODY, IGG: Actin (Smooth Muscle) Antibody (IGG): <20 U (ref <20)

## 2023-09-07 LAB — HEPATITIS C ANTIBODY: Hepatitis C Ab: NONREACTIVE

## 2023-09-07 LAB — HEPATITIS B CORE ANTIBODY, TOTAL: Hep B Core Total Ab: NONREACTIVE

## 2023-09-07 LAB — TISSUE TRANSGLUTAMINASE, IGA: (tTG) Ab, IgA: <1 U/mL

## 2023-09-07 LAB — IGA: Immunoglobulin A: 346 mg/dL — ABNORMAL HIGH (ref 47–310)

## 2023-09-07 LAB — ANA: Anti Nuclear Antibody (ANA): POSITIVE — AB

## 2023-09-07 LAB — MITOCHONDRIAL ANTIBODIES: Mitochondrial M2 Ab, IgG: <=20 U (ref <=20.0)

## 2023-09-07 LAB — HEPATITIS B SURFACE ANTIGEN: Hepatitis B Surface Ag: NONREACTIVE

## 2023-09-07 LAB — HEPATITIS A ANTIBODY, TOTAL: Hepatitis A AB,Total: REACTIVE — AB

## 2023-09-07 LAB — ALPHA-1-ANTITRYPSIN: A-1 Antitrypsin, Ser: 179 mg/dL (ref 83–199)

## 2023-09-10 ENCOUNTER — Ambulatory Visit: Payer: Self-pay | Admitting: Gastroenterology

## 2023-09-10 DIAGNOSIS — Z419 Encounter for procedure for purposes other than remedying health state, unspecified: Secondary | ICD-10-CM | POA: Diagnosis not present

## 2023-09-11 ENCOUNTER — Ambulatory Visit (HOSPITAL_COMMUNITY)

## 2023-09-11 ENCOUNTER — Other Ambulatory Visit: Payer: Self-pay | Admitting: Nurse Practitioner

## 2023-09-11 DIAGNOSIS — E119 Type 2 diabetes mellitus without complications: Secondary | ICD-10-CM

## 2023-09-11 NOTE — Telephone Encounter (Signed)
 Requesting: Ozempic  2 mg/dose (8 mg/3 mL) subcutaneous pen injector  Last Visit: 07/22/2023 Next Visit: 10/22/2023 Last Refill: 07/03/2023  Please Advise

## 2023-09-12 ENCOUNTER — Ambulatory Visit (INDEPENDENT_AMBULATORY_CARE_PROVIDER_SITE_OTHER)
Admission: RE | Admit: 2023-09-12 | Discharge: 2023-09-12 | Disposition: A | Discharge status: Home / Self Care | Source: Ambulatory Visit | Attending: Gastroenterology | Admitting: Gastroenterology

## 2023-09-12 DIAGNOSIS — Z944 Liver transplant status: Secondary | ICD-10-CM | POA: Diagnosis not present

## 2023-09-12 DIAGNOSIS — R748 Abnormal levels of other serum enzymes: Secondary | ICD-10-CM

## 2023-09-12 DIAGNOSIS — K709 Alcoholic liver disease, unspecified: Secondary | ICD-10-CM | POA: Diagnosis not present

## 2023-09-12 DIAGNOSIS — Z8379 Family history of other diseases of the digestive system: Secondary | ICD-10-CM

## 2023-09-12 DIAGNOSIS — R932 Abnormal findings on diagnostic imaging of liver and biliary tract: Secondary | ICD-10-CM | POA: Diagnosis not present

## 2023-09-12 DIAGNOSIS — K760 Fatty (change of) liver, not elsewhere classified: Secondary | ICD-10-CM

## 2023-09-12 DIAGNOSIS — K759 Inflammatory liver disease, unspecified: Secondary | ICD-10-CM | POA: Diagnosis not present

## 2023-09-13 ENCOUNTER — Telehealth: Payer: Self-pay | Admitting: Gastroenterology

## 2023-09-13 DIAGNOSIS — F331 Major depressive disorder, recurrent, moderate: Secondary | ICD-10-CM | POA: Diagnosis not present

## 2023-09-13 DIAGNOSIS — F840 Autistic disorder: Secondary | ICD-10-CM | POA: Diagnosis not present

## 2023-09-13 DIAGNOSIS — F900 Attention-deficit hyperactivity disorder, predominantly inattentive type: Secondary | ICD-10-CM | POA: Diagnosis not present

## 2023-09-13 DIAGNOSIS — F411 Generalized anxiety disorder: Secondary | ICD-10-CM | POA: Diagnosis not present

## 2023-09-13 NOTE — Telephone Encounter (Signed)
 Please let patient know that I have discussed his lab results with Dr. Wilhelmenia, who has suggested that based on patient's iron deficiency, even though he does not have anemia, endoscopic evaluation is warranted to rule out a GI source of blood loss and iron deficiency. If this is something he would like to move forward with, we can schedule him for EGD/colonoscopy.  Alternatively, if he would like to discuss this further and repeat some blood work, we can schedule him for a follow-up office visit with myself or with Dr. Wilhelmenia in 8 to 12 weeks, with repeat CBC and iron studies at that time.

## 2023-09-17 ENCOUNTER — Ambulatory Visit: Payer: Self-pay | Admitting: Gastroenterology

## 2023-09-17 DIAGNOSIS — Z8379 Family history of other diseases of the digestive system: Secondary | ICD-10-CM

## 2023-09-17 DIAGNOSIS — R748 Abnormal levels of other serum enzymes: Secondary | ICD-10-CM

## 2023-09-17 DIAGNOSIS — K760 Fatty (change of) liver, not elsewhere classified: Secondary | ICD-10-CM
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
## 2023-09-25 ENCOUNTER — Inpatient Hospital Stay (HOSPITAL_BASED_OUTPATIENT_CLINIC_OR_DEPARTMENT_OTHER): Admission: RE | Admit: 2023-09-25 | Source: Ambulatory Visit | Admitting: Radiology

## 2023-09-25 ENCOUNTER — Other Ambulatory Visit (HOSPITAL_BASED_OUTPATIENT_CLINIC_OR_DEPARTMENT_OTHER): Admitting: Radiology

## 2023-09-27 ENCOUNTER — Other Ambulatory Visit: Payer: Self-pay | Admitting: Nurse Practitioner

## 2023-10-02 ENCOUNTER — Ambulatory Visit (INDEPENDENT_AMBULATORY_CARE_PROVIDER_SITE_OTHER)
Admission: RE | Admit: 2023-10-02 | Discharge: 2023-10-02 | Disposition: A | Discharge status: Home / Self Care | Source: Ambulatory Visit | Attending: Gastroenterology | Admitting: Gastroenterology

## 2023-10-02 DIAGNOSIS — R748 Abnormal levels of other serum enzymes: Secondary | ICD-10-CM | POA: Diagnosis not present

## 2023-10-02 DIAGNOSIS — Z8379 Family history of other diseases of the digestive system: Secondary | ICD-10-CM

## 2023-10-02 DIAGNOSIS — Z9049 Acquired absence of other specified parts of digestive tract: Secondary | ICD-10-CM | POA: Diagnosis not present

## 2023-10-02 DIAGNOSIS — K760 Fatty (change of) liver, not elsewhere classified: Secondary | ICD-10-CM | POA: Diagnosis not present

## 2023-10-02 DIAGNOSIS — R7989 Other specified abnormal findings of blood chemistry: Secondary | ICD-10-CM | POA: Diagnosis not present

## 2023-10-02 DIAGNOSIS — R160 Hepatomegaly, not elsewhere classified: Secondary | ICD-10-CM | POA: Diagnosis not present

## 2023-10-02 MED ORDER — IOHEXOL 300 MG/ML  SOLN
100.0000 mL | Freq: Once | INTRAMUSCULAR | Status: AC | PRN
  Administered 2023-10-02: 100 mL via INTRAVENOUS

## 2023-10-04 DIAGNOSIS — F411 Generalized anxiety disorder: Secondary | ICD-10-CM | POA: Diagnosis not present

## 2023-10-04 DIAGNOSIS — F900 Attention-deficit hyperactivity disorder, predominantly inattentive type: Secondary | ICD-10-CM | POA: Diagnosis not present

## 2023-10-04 DIAGNOSIS — F840 Autistic disorder: Secondary | ICD-10-CM | POA: Diagnosis not present

## 2023-10-04 DIAGNOSIS — F331 Major depressive disorder, recurrent, moderate: Secondary | ICD-10-CM | POA: Diagnosis not present

## 2023-10-07 ENCOUNTER — Ambulatory Visit: Payer: Self-pay | Admitting: Gastroenterology

## 2023-10-10 ENCOUNTER — Other Ambulatory Visit: Payer: Self-pay

## 2023-10-10 DIAGNOSIS — K760 Fatty (change of) liver, not elsewhere classified: Secondary | ICD-10-CM

## 2023-10-11 DIAGNOSIS — Z419 Encounter for procedure for purposes other than remedying health state, unspecified: Secondary | ICD-10-CM | POA: Diagnosis not present

## 2023-10-14 ENCOUNTER — Other Ambulatory Visit: Payer: Self-pay | Admitting: Nurse Practitioner

## 2023-10-14 NOTE — Telephone Encounter (Signed)
 Requesting: valsartan  160 mg tablet , metformin  ER 500 mg tablet,extended release 24 hr  Last Visit: 07/22/2023 Next Visit: 10/22/2023 Last Refill: 05/20/2023  Please Advise

## 2023-10-18 DIAGNOSIS — F900 Attention-deficit hyperactivity disorder, predominantly inattentive type: Secondary | ICD-10-CM | POA: Diagnosis not present

## 2023-10-18 DIAGNOSIS — F331 Major depressive disorder, recurrent, moderate: Secondary | ICD-10-CM | POA: Diagnosis not present

## 2023-10-18 DIAGNOSIS — F840 Autistic disorder: Secondary | ICD-10-CM | POA: Diagnosis not present

## 2023-10-18 DIAGNOSIS — F411 Generalized anxiety disorder: Secondary | ICD-10-CM | POA: Diagnosis not present

## 2023-10-22 ENCOUNTER — Encounter: Payer: Self-pay | Admitting: Nurse Practitioner

## 2023-10-22 ENCOUNTER — Ambulatory Visit: Admitting: Nurse Practitioner

## 2023-10-22 ENCOUNTER — Ambulatory Visit: Payer: Self-pay | Admitting: Nurse Practitioner

## 2023-10-22 VITALS — BP 118/86 | HR 86 | Temp 96.8°F | Ht 74.0 in | Wt 386.2 lb

## 2023-10-22 DIAGNOSIS — F419 Anxiety disorder, unspecified: Secondary | ICD-10-CM

## 2023-10-22 DIAGNOSIS — Z23 Encounter for immunization: Secondary | ICD-10-CM

## 2023-10-22 DIAGNOSIS — Z7985 Long-term (current) use of injectable non-insulin antidiabetic drugs: Secondary | ICD-10-CM | POA: Diagnosis not present

## 2023-10-22 DIAGNOSIS — E611 Iron deficiency: Secondary | ICD-10-CM

## 2023-10-22 DIAGNOSIS — Z7984 Long term (current) use of oral hypoglycemic drugs: Secondary | ICD-10-CM

## 2023-10-22 DIAGNOSIS — E119 Type 2 diabetes mellitus without complications: Secondary | ICD-10-CM

## 2023-10-22 DIAGNOSIS — K760 Fatty (change of) liver, not elsewhere classified: Secondary | ICD-10-CM | POA: Diagnosis not present

## 2023-10-22 LAB — POCT GLYCOSYLATED HEMOGLOBIN (HGB A1C)
HbA1c POC (<> result, manual entry): 5.7 % (ref 4.0–5.6)
HbA1c, POC (controlled diabetic range): 5.7 % (ref 0.0–7.0)
HbA1c, POC (prediabetic range): 5.7 % (ref 5.7–6.4)
Hemoglobin A1C: 5.7 % — AB (ref 4.0–5.6)

## 2023-10-22 LAB — MICROALBUMIN / CREATININE URINE RATIO
Creatinine,U: 188.6 mg/dL
Microalb Creat Ratio: 5.8 mg/g (ref 0.0–30.0)
Microalb, Ur: 1.1 mg/dL (ref 0.0–1.9)

## 2023-10-22 NOTE — Assessment & Plan Note (Signed)
 CT abdomen indicates changes suggestive of cirrhosis, but liver function tests have improved. Continue collaboration and recommendations from GI.

## 2023-10-22 NOTE — Assessment & Plan Note (Signed)
 Chronic, stable. A1c today is 5.7%. A recent missed dose of Ozempic  resulted in increased hunger and poor glycemic control, though weight remains stable and blood pressure is controlled. Continue Ozempic  2mg  weekly and metformin  XR 500mg  daily, monitor blood sugar levels regularly, and ensure medication adherence.

## 2023-10-22 NOTE — Assessment & Plan Note (Signed)
 Low iron levels persist, and he is currently on iron supplements. Continue iron supplementation as prescribed.

## 2023-10-22 NOTE — Assessment & Plan Note (Signed)
Continue metformin XR 500 mg daily.

## 2023-10-22 NOTE — Assessment & Plan Note (Signed)
 Chronic, ongoing. He is still following with psychiatry and a therapist. Continue latuda 40mg  daily, Wellbturin XL 300mg  daily, cymbalta  30mg  daily.  Continue collaboration and recommendations from psychiatry.
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
## 2023-10-22 NOTE — Assessment & Plan Note (Signed)
 BMI 49.5. Continue focus on nutrition and start to add exercise.

## 2023-10-22 NOTE — Patient Instructions (Signed)
 It was great to see you!  Let me know if you need any refills   Let's follow-up in 3 months, sooner if you have concerns.  If a referral was placed today, you will be contacted for an appointment. Please note that routine referrals can sometimes take up to 3-4 weeks to process. Please call our office if you haven't heard anything after this time frame.  Take care,  Tinnie Harada, NP

## 2023-10-22 NOTE — Assessment & Plan Note (Signed)
 Continue ozempic 2mg  weekly.

## 2023-11-01 DIAGNOSIS — F331 Major depressive disorder, recurrent, moderate: Secondary | ICD-10-CM | POA: Diagnosis not present

## 2023-11-01 DIAGNOSIS — F411 Generalized anxiety disorder: Secondary | ICD-10-CM | POA: Diagnosis not present

## 2023-11-01 DIAGNOSIS — F840 Autistic disorder: Secondary | ICD-10-CM | POA: Diagnosis not present

## 2023-11-01 DIAGNOSIS — F900 Attention-deficit hyperactivity disorder, predominantly inattentive type: Secondary | ICD-10-CM | POA: Diagnosis not present

## 2023-11-04 DIAGNOSIS — F411 Generalized anxiety disorder: Secondary | ICD-10-CM | POA: Diagnosis not present

## 2023-11-04 DIAGNOSIS — F331 Major depressive disorder, recurrent, moderate: Secondary | ICD-10-CM | POA: Diagnosis not present

## 2023-11-04 DIAGNOSIS — F900 Attention-deficit hyperactivity disorder, predominantly inattentive type: Secondary | ICD-10-CM | POA: Diagnosis not present

## 2023-11-13 DIAGNOSIS — F900 Attention-deficit hyperactivity disorder, predominantly inattentive type: Secondary | ICD-10-CM | POA: Diagnosis not present

## 2023-11-13 DIAGNOSIS — F331 Major depressive disorder, recurrent, moderate: Secondary | ICD-10-CM | POA: Diagnosis not present

## 2023-11-13 DIAGNOSIS — F411 Generalized anxiety disorder: Secondary | ICD-10-CM | POA: Diagnosis not present

## 2023-11-15 DIAGNOSIS — F900 Attention-deficit hyperactivity disorder, predominantly inattentive type: Secondary | ICD-10-CM | POA: Diagnosis not present

## 2023-11-15 DIAGNOSIS — F331 Major depressive disorder, recurrent, moderate: Secondary | ICD-10-CM | POA: Diagnosis not present

## 2023-11-15 DIAGNOSIS — F411 Generalized anxiety disorder: Secondary | ICD-10-CM | POA: Diagnosis not present

## 2023-11-15 DIAGNOSIS — F840 Autistic disorder: Secondary | ICD-10-CM | POA: Diagnosis not present

## 2023-11-20 ENCOUNTER — Encounter: Payer: Self-pay | Admitting: Endocrinology

## 2023-11-20 ENCOUNTER — Ambulatory Visit (INDEPENDENT_AMBULATORY_CARE_PROVIDER_SITE_OTHER): Admitting: Endocrinology

## 2023-11-20 VITALS — BP 100/70 | HR 80 | Resp 20 | Ht 74.0 in | Wt 384.0 lb

## 2023-11-20 DIAGNOSIS — E230 Hypopituitarism: Secondary | ICD-10-CM

## 2023-11-20 DIAGNOSIS — E221 Hyperprolactinemia: Secondary | ICD-10-CM | POA: Diagnosis not present

## 2023-11-20 DIAGNOSIS — E236 Other disorders of pituitary gland: Secondary | ICD-10-CM

## 2023-11-20 DIAGNOSIS — N62 Hypertrophy of breast: Secondary | ICD-10-CM

## 2023-11-20 DIAGNOSIS — Z125 Encounter for screening for malignant neoplasm of prostate: Secondary | ICD-10-CM

## 2023-11-20 NOTE — Progress Notes (Signed)
 Outpatient Endocrinology Note Peter Fartun Paradiso, MD   Patient's Name: Peter [place]rez    DOB: 04/09/1980    MRN: [id number]  REASON OF VISIT: New consult for hypogonadism / hyperprolactinemia  REFERRING PROVIDER: Nedra Tinnie LABOR, NP  PCP:  Nedra Tinnie LABOR, NP  HISTORY OF PRESENT ILLNESS:   Peter [place]rez is a 43 y.o. old male with past medical history listed below, is here for new consult for hyperprolactinemia and low testosterone .  Pertinent Hx: Patient is referred to endocrinology for evaluation and management of hyperprolactinemia and low testosterone .  Laboratory test were done in April 2025.  Initial consult on November 20, 2023.  Patient had complaints of nipple pain, was evaluated by primary care provider in March 2025.  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  Patient had MRI pituitary in July 28, 2023 showed no pituitary mass or lesion identified.  On my review has partially empty sella.  Mammogram in April 30, 2023 showed mild bilateral benign gynecomastia.  No evidence of malignancy in the bilateral breast.  Patient has major depressive disorder, following with psychiatry, [facility] behavior provider, on mental health medication including Latuda /lurasidone.  Other mental health medications Cymbalta , Wellbutrin .  Patient has complaints of significantly low energy no motivation to do things, significant erectile dysfunction.  He reports he has fair libido.  He denies breast discharge.  No new headache.  No peripheral vision loss.  Labs :   Latest Reference Range & Units 04/19/23 08:57  Sex Horm Binding Glob, Serum 10 - 50 nmol/L 52 (H)  Testosterone  300.00 - 890.00 ng/dL 05.20 (L)  (H): Data is abnormally  high (L): Data is abnormally low   Latest Reference Range & Units 05/10/23 08:27  LH 1.5 - 9.3 mIU/mL 1.4 (L)  FSH 1.4 - 12.8 mIU/mL 2.4  Prolactin 2.0 - 18.0 ng/mL 83.4 (H)  Testosterone  264 - 916 ng/dL 857 (L)  Testosterone  Free 6.8 - 21.5 pg/mL 3.0 (L)  (L): Data is abnormally low (H): Data is abnormally high   Latest Reference Range & Units 09/04/23 14:25  TSH 0.35 - 5.50 uIU/mL 1.04   Other laboratory evaluation he had normal thyroid  function test.  He has normal renal function.  He had elevated liver enzymes, improving.  He has hepatic steatosis.  She has been following with gastroenterology.  Libido:                      Fair  erectile Dysfunction: Yes Gynecomastia:           No Fathered any child:     Yes, 4 biological, grown up.  Shaving less often:     No Opioid use:                  No Decreased strength    No Decreased Muscle      No Tiredness/Fatigue       Yes Anabolic steroids:        No Weight lifting:              No Excessive exercise:    No Fractures:                    No Vision issues  No Sense of Smell           Intact Truama, Radiation, Mumps  No  # Type 2 diabetes mellitus controlled with hemoglobin A1c of 5.7% in September 2025, on Ozempic  and metformin , managed by PCP.  # Reports he was diagnosed with obstructive sleep apnea several years ago, has not been using CPAP lately.  Interval history Patient presented for evaluation and management of hyperprolactinemia and hypogonadism.   REVIEW OF SYSTEMS:  As per history of present illness.   PAST MEDICAL HISTORY: Past Medical History:  Diagnosis Date   Depression    DM (diabetes mellitus) (HCC)    Elevated liver function tests    Fatty liver    Gallstones    Hypertension    Kidney stone    Morbid obesity (HCC)    PONV (postoperative nausea and vomiting)    Prediabetes    Sinus tachycardia    Sleep apnea    CPAP    PAST SURGICAL HISTORY: Past Surgical History:  Procedure  Laterality Date   CHOLECYSTECTOMY     EXTRACORPOREAL SHOCK WAVE LITHOTRIPSY Right 07/27/2019   Procedure: EXTRACORPOREAL SHOCK WAVE LITHOTRIPSY (ESWL);  Surgeon: Renda Glance, MD;  Location: [facility];  Service: Urology;  Laterality: Right;   SMALL INTESTINE SURGERY     about age 26 months or 3 weeks    ALLERGIES: No Known Allergies  FAMILY HISTORY:  Family History  Problem Relation Age of Onset   Kidney disease Mother    Diabetes Mother    Liver disease Mother    Stroke Father    Diabetes Father    Arthritis Father    Heart disease Father    Hypertension Father    Hyperlipidemia Father    Heart attack Father    GI Bleed Father    Hypertension Brother    Hyperlipidemia Brother    Colon cancer Maternal Grandfather    Heart disease Maternal Grandfather    Hypertension Maternal Grandfather    Hyperlipidemia Maternal Grandfather    Heart attack Maternal Grandfather    Diverticulitis Maternal Grandfather    Emphysema Paternal Grandmother    Autism Daughter    Anxiety disorder Daughter    Depression Daughter    Autism Daughter    Anxiety disorder Daughter    Depression Daughter    Anxiety disorder Daughter    Depression Daughter     SOCIAL HISTORY: Social History   Socioeconomic History   Marital status: Married    Spouse name: Not on file   Number of children: 3   Years of education: Not on file   Highest education level: Associate degree: occupational, Scientist, product/process development, or vocational program  Occupational History   Not on file  Tobacco Use   Smoking status: Former    Current packs/day: 0.00    Average packs/day: 0.5 packs/day for 7.0 years (3.5 ttl pk-yrs)    Types: Cigarettes    Start date: 2004    Quit date: 2011    Years since quitting: 14.8   Smokeless tobacco: Current    Types: Chew  Vaping Use   Vaping status: Never Used  Substance and Sexual Activity   Alcohol use: Not Currently    Alcohol/week: 49.0 standard drinks of alcohol     Types: 49 Shots of liquor per week    Comment: quit in 2024   Drug use: Not Currently   Sexual activity: Yes  Other Topics Concern   Not on file  Social History Narrative  Not on file   Social Drivers of Health   Financial Resource Strain: Medium Risk (07/22/2023)   Overall Financial Resource Strain (CARDIA)    Difficulty of Paying Living Expenses: Somewhat hard  Food Insecurity: No Food Insecurity (07/22/2023)   Hunger Vital Sign    Worried About Running Out of Food in the Last Year: Never true    Ran Out of Food in the Last Year: Never true  Transportation Needs: No Transportation Needs (07/22/2023)   PRAPARE - Administrator, Civil Service (Medical): No    Lack of Transportation (Non-Medical): No  Physical Activity: Insufficiently Active (07/22/2023)   Exercise Vital Sign    Days of Exercise per Week: 1 day    Minutes of Exercise per Session: 10 min  Stress: No Stress Concern Present (07/22/2023)   [employer] of Occupational Health - Occupational Stress Questionnaire    Feeling of Stress: Only a little  Social Connections: Moderately Integrated (07/22/2023)   Social Connection and Isolation Panel    Frequency of Communication with Friends and Family: Twice a week    Frequency of Social Gatherings with Friends and Family: Once a week    Attends Religious Services: More than 4 times per year    Active Member of [employer] or Organizations: No    Attends Engineer, structural: Not on file    Marital Status: Married    MEDICATIONS:  Current Outpatient Medications  Medication Sig Dispense Refill   albuterol  (VENTOLIN  HFA) 108 (90 Base) MCG/ACT inhaler Inhale 1-2 puffs into the lungs every 6 (six) hours as needed for wheezing or shortness of breath. 8 g 0   ALPRAZolam  (XANAX ) 0.5 MG tablet Take 1 tablet (0.5 mg total) by mouth 2 (two) times daily as needed for anxiety. 60 tablet 0   amphetamine-dextroamphetamine (ADDERALL XR) 25 MG 24 hr capsule Take 25 mg by  mouth every morning.     aspirin 325 MG tablet Take 325 mg by mouth daily.     buPROPion  (WELLBUTRIN  XL) 300 MG 24 hr tablet Take 1 tablet (300 mg total) by mouth daily. 90 tablet 0   cloNIDine  (CATAPRES ) 0.1 MG tablet Take 1 tablet (0.1 mg total) by mouth at bedtime. 90 tablet 1   Clotrimazole  1 % OINT Apply 1 application. topically daily. 56.7 g 2   DULoxetine  (CYMBALTA ) 60 MG capsule Take 60 mg by mouth at bedtime.     ferrous sulfate 325 (65 FE) MG tablet Take 325 mg by mouth daily with breakfast.     lurasidone (LATUDA) 40 MG TABS tablet Take 40 mg by mouth daily with breakfast.     meloxicam  (MOBIC ) 15 MG tablet Take 1 tablet (15 mg total) by mouth daily as needed. 90 tablet 0   metFORMIN  (GLUCOPHAGE -XR) 500 MG 24 hr tablet Take 1 tablet (500 mg total) by mouth daily with breakfast. 90 tablet 1   metoprolol  succinate (TOPROL -XL) 100 MG 24 hr tablet Take 1 tablet by mouth daily. Take with or immediately following a meal. 90 tablet 1   Multiple Vitamin (MULTIVITAMIN ADULT PO) Take by mouth.     Omega-3 Fatty Acids (FISH OIL OMEGA-3 PO) Take by mouth.     omeprazole  (PRILOSEC) 20 MG capsule TAKE ONE CAPSULE BY MOUTH DAILY 60 capsule 3   OZEMPIC , 2 MG/DOSE, 8 MG/3ML SOPN Inject 2 mg as directed once a week. 3 mL 2   tamsulosin  (FLOMAX ) 0.4 MG CAPS capsule TAKE ONE CAPSULE BY MOUTH DAILY 90 capsule 1  traZODone  (DESYREL ) 100 MG tablet Take 1 tablet (100 mg total) by mouth at bedtime. 90 tablet 0   valsartan  (DIOVAN ) 160 MG tablet TAKE ONE TABLET BY MOUTH DAILY 60 tablet 2   No current facility-administered medications for this visit.    PHYSICAL EXAM: Vitals:   11/20/23 1006  BP: 100/70  Pulse: 80  Resp: 20  SpO2: 96%  Weight: (!) 384 lb (174.2 kg)  Height: 6' 2 (1.88 m)   Body mass index is 49.3 kg/m.  Wt Readings from Last 3 Encounters:  11/20/23 (!) 384 lb (174.2 kg)  10/22/23 (!) 386 lb 3.2 oz (175.2 kg)  09/04/23 (!) 384 lb (174.2 kg)    General: Well developed, well  nourished male in no apparent distress. Non-Cushingoid. No obvious Klinefelter's syndrome body habitus HEENT: AT/[place], no external lesions. Hearing intact to the spoken word Eyes: EOMI. No proptosis, stare and lid lag. Conjunctiva clear and no icterus. Visual field grossly intact in confrontation Neck: Trachea midline, neck supple without appreciable thyromegaly or lymphadenopathy and no palpable thyroid  nodules Abdomen: Soft, non tender, non distended. Neurologic: Alert, oriented, normal speech, deep tendon biceps reflexes normal,  no gross focal neurological deficit. No obvious proximal weakness Extremities: No pedal pitting edema, no tremors of outstretched hands, no excessive hair growth Skin: Warm, color good.  Breast: Bilateral gynecomastia present, nontender, no discharge.   PERTINENT HISTORIC LABORATORY AND IMAGING STUDIES:  All pertinent laboratory results were reviewed. Please see HPI also for further details.   ASSESSMENT / PLAN  1. Hyperprolactinemia   2. Hypogonadotropic hypogonadism   3. Empty sella   4. Screening for prostate cancer   5. Gynecomastia, male    Patient had a laboratory evaluation in March/April 2025 had elevated prolactin 83 with hyperprolactinemia and significantly low total and free testosterone  and low gonadotropins consistent with hypogonadotropic hypogonadism.  Patient had MRI pituitary in June 2025 with no pituitary mass /nodule , and has partial empty sella, on my review.    Patient has secondary/hypogonadotropic hypogonadism likely related to hyperprolactinemia.  It may have been contributed by obesity/BMI 49 and obstructive sleep apnea.  He reports he was diagnosed with obstructive sleep apnea several years ago however has not been using CPAP.  Since there is no pituitary adenoma on MRI hyperprolactinemia is most likely related to mental health medications especially Latuda.  Oleta has side effect as causing hyperprolactinemia.  He has been taking  multiple mental health medication, following with psychiatrist.  He has bilateral small gynecomastia likely related to hypogonadism /and hyperprolactinemia.  Plan: - I would like to have laboratory evaluation for total, free testosterone  along with gonadotropins, prolactin and macroprolactin with other pituitary hormones including ACTH, cortisol and IGF-I as he also has partial empty sella.  He will complete lab in the early morning fasting. - I would like to check iron studies, Sex hormone binding globulin and PSA. - He had recent TSH normal.  I would like to check free T4. - Patient is asked to discuss with psychiatrist, he reports he has follow-up in 1 week,  if any of her mental health medications especially Latuda can be switched that will cause no prolactin problem. - Treatment for hyperprolactinemia should be coordinated with psychiatrist.  At this time no plan for treatment for hyperprolactinemia. - In regard to treatment for hypogonadism, it depends on medication adjustment by psychiatrist to lower prolactin.  He also needs to be evaluated for obstructive sleep apnea and start treatment before testosterone  therapy if planned. -  Patient is asked to discuss with primary care provider for reevaluation of obstructive sleep apnea/sleep study and treatment.   Diagnoses and all orders for this visit:  Hyperprolactinemia -     Prolactin -     Prolactin, Total and Monomeric  Hypogonadotropic hypogonadism -     Testosterone  , Free and Total -     Sex hormone binding globulin -     Iron, TIBC and Ferritin Panel  Empty sella -     ACTH -     Cortisol -     Insulin-like growth factor -     T4, free  Screening for prostate cancer -     PSA  Gynecomastia, male   DISPOSITION Follow up in clinic in 6 weeks suggested.  All questions answered and patient verbalized understanding of the plan.  Peter Oneal Biglow, MD [facility] Endocrinology [facility] [address] [place], [address] [place], [place] [address] Phone # [phone]  At least part of this note was generated using voice recognition software. Inadvertent word errors may have occurred, which were not recognized during the proofreading process.

## 2023-11-20 NOTE — Patient Instructions (Addendum)
 Please check with her psychiatrist if any of her mental health medications especially Latuda can be switched that will cause no prolactin problem.  Discussed with primary care provider for follow-up for obstructive sleep apnea.  Complete lab in the early morning fasting.   Lurasidone Tablets What is this medication? LURASIDONE (loo RAS i done) treats schizophrenia and bipolar depression. It works by balancing the levels of dopamine and serotonin in your brain, substances that help regulate mood, behaviors, and thoughts. It belongs to a group of medications called antipsychotics. Antipsychotic medications can be used to treat several kinds of mental health conditions. This medicine may be used for other purposes; ask your health care provider or pharmacist if you have questions. COMMON BRAND NAME(S): Latuda What should I tell my care team before I take this medication? They need to know if you have any of these conditions: Dementia Diabetes Difficulty swallowing Have trouble controlling your muscles Heart disease High cholesterol History of breast cancer History of high prolactin levels History of stroke Kidney disease Liver disease Low blood counts, like low white cell, platelet, or red cell counts Low blood pressure Parkinson's disease Seizures Suicidal thoughts, plans, or attempt; a previous suicide attempt by you or a family member An unusual or allergic reaction to lurasidone, other medications, foods, dyes, or preservatives Pregnant or trying to get pregnant Breast-feeding How should I use this medication? Take this medication by mouth with a glass of water. Follow the directions on the prescription label. Take this medication with food. Take your medication at regular intervals. Do not take it more often than directed. Do not stop taking except on your care team's advice. A special MedGuide will be given to you by the pharmacist with each prescription and refill. Be sure to  read this information carefully each time. Talk to your care team about the use of this medication in children. While this medication may be prescribed for children as young as 10 years for selected conditions, precautions do apply. Overdosage: If you think you have taken too much of this medicine contact a poison control center or emergency room at once. NOTE: This medicine is only for you. Do not share this medicine with others. What if I miss a dose? If you miss a dose, take it as soon as you can. If it is almost time for your next dose, take only that dose. Do not take double or extra doses. What may interact with this medication? Do not take this medication with any of the following: Adagrasib Avasimibe Carbamazepine Certain medications for fungal infections, such as ketoconazole, voriconazole Clarithromycin Metoclopramide Mibefradil Phenytoin Rifampin Ritonavir St. John's wort This medication may also interact with the following: Antihistamines for allergy, cough, and cold Bosentan Certain medications for anxiety or sleep Certain medications for depression, such as amitriptyline, fluoxetine, sertraline Certain medications for HIV or hepatitis Erythromycin Fluconazole General anesthetics, such as halothane, isoflurane, methoxyflurane, propofol Grapefruit juice Levodopa or other medications for Parkinson disease Medications for blood pressure Medications for seizures Medications that relax muscles for surgery Modafinil Nafcillin Opioid medications for pain Phenothiazines, such as chlorpromazine, prochlorperazine, thioridazine This list may not describe all possible interactions. Give your health care provider a list of all the medicines, herbs, non-prescription drugs, or dietary supplements you use. Also tell them if you smoke, drink alcohol, or use illegal drugs. Some items may interact with your medicine. What should I watch for while using this medication? Visit your  care team for regular checks on your progress. Tell  your care team if symptoms do not start to get better or if they get worse. Do not stop taking except on your care team's advice. You may develop a severe reaction. Your care team will tell you how much medication to take. Patients and their families should watch out for new or worsening depression or thoughts of suicide. Also watch out for sudden changes in feelings such as feeling anxious, agitated, panicky, irritable, hostile, aggressive, impulsive, severely restless, overly excited and hyperactive, or not being able to sleep. If this happens, especially at the beginning of treatment or after a change in dose, call your care team. This medication may increase blood sugar. Ask your care team if changes in diet or medications are needed if you have diabetes. You may get dizzy or drowsy. Do not drive, use machinery, or do anything that needs mental alertness until you know how this medication affects you. Do not stand or sit up quickly, especially if you are an older patient. This reduces the risk of dizzy or fainting spells. Alcohol may interfere with the effect of this medication. Avoid alcoholic drinks. This medication may cause dry eyes and blurred vision. If you wear contact lenses you may feel some discomfort. Lubricating drops may help. See your eye care team if the problem does not go away or is severe. This medication can cause problems with controlling your body temperature. It can lower the response of your body to cold temperatures. If possible, stay indoors during cold weather. If you must go outdoors, wear warm clothes. It can also lower the response of your body to heat. Do not overheat. Do not over-exercise. Stay out of the sun when possible. If you must be in the sun, wear cool clothing. Drink plenty of water. If you have trouble controlling your body temperature, call your care team right away. What side effects may I notice from receiving  this medication? Side effects that you should report to your care team as soon as possible: Allergic reactions--skin rash, itching, hives, swelling of the face, lips, tongue, or throat High blood sugar (hyperglycemia)--increased thirst or amount of urine, unusual weakness or fatigue, blurry vision High fever, stiff muscles, increased sweating, fast or irregular heartbeat, and confusion, which may be signs of neuroleptic malignant syndrome High prolactin level--unexpected breast tissue growth, discharge from the nipple, change in sex drive or performance, irregular menstrual cycle Infection--fever, chills, cough, or sore throat Low blood pressure--dizziness, feeling faint or lightheaded, blurry vision Pain or trouble swallowing Seizures Stroke--sudden numbness or weakness of the face, arm, or leg, trouble speaking, confusion, trouble walking, loss of balance or coordination, dizziness, severe headache, change in vision Thoughts of suicide or self-harm, worsening mood, feelings of depression Uncontrolled and repetitive body movements, muscle stiffness or spasms, tremors or shaking, loss of balance or coordination, restlessness, shuffling walk, which may be signs of extrapyramidal symptoms (EPS) Side effects that usually do not require medical attention (report to your care team if they continue or are bothersome): Drowsiness Nausea Restlessness Runny or stuffy nose Trouble sleeping Weight gain This list may not describe all possible side effects. Call your doctor for medical advice about side effects. You may report side effects to FDA at 1-800-FDA-1088. Where should I keep my medication? Keep out of the reach of children. Store at room temperature between 15 and 30 degrees C (59 and 86 degrees F). Throw away any unused medication after the expiration date. NOTE: This sheet is a summary. It may not cover all possible  information. If you have questions about this medicine, talk to your doctor,  pharmacist, or health care provider.  2024 Elsevier/Gold Standard (2021-01-26 00:00:00)

## 2023-11-24 NOTE — Progress Notes (Unsigned)
 Peter Gutierrez [id number] 1981/01/27   Chief Complaint:  Referring Provider: Nedra Tinnie LABOR, NP Primary GI MD: Dr. Wilhelmenia  HPI: Peter Gutierrez is a 43 y.o. male with past medical history of fatty liver, elevated liver enzymes, HTN, obesity, kidney stones, T2DM, sleep apnea, cholecystectomy who presents today for follow up.  Has had elevation in AST and ALT since 2022.   Labs 07/22/2023: AST 62, ALT 63, alk phos 43, total bilirubin 0.4, albumin 3.9, normal CBC, hemoglobin A1c 6.3, high triglycerides  On Ozempic  for diabetes management.  Initial visit 09/04/2023 for workup of elevated liver enzymes and fatty liver.  Patient with multiple risk factors for metabolic associated liver disease.  Family history of cirrhosis in his mother who required a liver transplant.  Labs significant for positive ANA with low titer, negative autoimmune markers of the liver, negative hepatitis, mild elevation in AST to 47 otherwise unremarkable liver enzymes.  Had a normal hemoglobin but low iron and was advised to start an iron supplement.  Based on low iron was also advised to have EGD/colonoscopy which he declined.  RUQ US  with elastography showed hepatic steatosis and median kPA of 58, highly suggestive of cACLD with increased probability of clinically significant portal hypertension.  Follow-up CT 10/02/2023 showed hepatic steatosis with morphologic changes suggestive of cirrhosis.  Called patient again to discuss findings and advised EGD to evaluate for esophageal varices and portal hypertensive gastropathy.  He was agreeable to EGD but not colonoscopy, however when called by nursing staff to set this up he declined to schedule. Advised to come back in for AFP level, vitamin D, and advised vaccination for hep B.  MELD score of 7.  Seen by endocrinology 11/20/2023, new consult for hyperprolactinemia and low testosterone .  Multiple labs have been ordered but not completed to include repeat iron and  ferritin.    Discussed the use of AI scribe software for clinical note transcription with the patient, who gave verbal consent to proceed.  History of Present Illness       Previous GI Procedures/Imaging   CT A/P 10/02/2023 1. Hepatic steatosis with morphologic changes suggestive of cirrhosis. 2. Nonobstructive 2 mm right interpolar renal stone.  RUQ US  with elastography IMPRESSION: ULTRASOUND RUQ:   1.  Hepatic steatosis.   2.  Cholecystectomy.   ULTRASOUND HEPATIC ELASTOGRAPHY:   Median kPa:  58   Diagnostic category: > or =17 kPa: highly suggestive of cACLD with an increased probability of clinically significant portal hypertension  RUQ US  09/26/2021 - Increased hepatic parenchymal echogenicity suggestive of steatosis. - Prior cholecystectomy   Past Medical History:  Diagnosis Date   ADHD    Autistic disorder    Depression    DM (diabetes mellitus) (HCC)    Elevated liver function tests    Fatty liver    Gallstones    Gynecomastia    Hyperprolactinemia    Hypertension    Hypogonadotropic hypogonadism    Kidney stone    Morbid obesity (HCC)    PONV (postoperative nausea and vomiting)    Prediabetes    Sinus tachycardia    Sleep apnea    CPAP    Past Surgical History:  Procedure Laterality Date   CHOLECYSTECTOMY     EXTRACORPOREAL SHOCK WAVE LITHOTRIPSY Right 07/27/2019   Procedure: EXTRACORPOREAL SHOCK WAVE LITHOTRIPSY (ESWL);  Surgeon: Renda Glance, MD;  Location: [facility];  Service: Urology;  Laterality: Right;   SMALL INTESTINE SURGERY     about age 4  months or 3 weeks    Current Outpatient Medications  Medication Sig Dispense Refill   albuterol  (VENTOLIN  HFA) 108 (90 Base) MCG/ACT inhaler Inhale 1-2 puffs into the lungs every 6 (six) hours as needed for wheezing or shortness of breath. 8 g 0   ALPRAZolam  (XANAX ) 0.5 MG tablet Take 1 tablet (0.5 mg total) by mouth 2 (two) times daily as needed for anxiety. 60 tablet 0    amphetamine-dextroamphetamine (ADDERALL XR) 25 MG 24 hr capsule Take 25 mg by mouth every morning.     aspirin 325 MG tablet Take 325 mg by mouth daily.     buPROPion  (WELLBUTRIN  XL) 300 MG 24 hr tablet Take 1 tablet (300 mg total) by mouth daily. 90 tablet 0   cloNIDine  (CATAPRES ) 0.1 MG tablet Take 1 tablet (0.1 mg total) by mouth at bedtime. 90 tablet 1   Clotrimazole  1 % OINT Apply 1 application. topically daily. 56.7 g 2   DULoxetine  (CYMBALTA ) 60 MG capsule Take 60 mg by mouth at bedtime.     ferrous sulfate 325 (65 FE) MG tablet Take 325 mg by mouth daily with breakfast.     lurasidone (LATUDA) 40 MG TABS tablet Take 40 mg by mouth daily with breakfast.     meloxicam  (MOBIC ) 15 MG tablet Take 1 tablet (15 mg total) by mouth daily as needed. 90 tablet 0   metFORMIN  (GLUCOPHAGE -XR) 500 MG 24 hr tablet Take 1 tablet (500 mg total) by mouth daily with breakfast. 90 tablet 1   metoprolol  succinate (TOPROL -XL) 100 MG 24 hr tablet Take 1 tablet by mouth daily. Take with or immediately following a meal. 90 tablet 1   Multiple Vitamin (MULTIVITAMIN ADULT PO) Take by mouth.     Omega-3 Fatty Acids (FISH OIL OMEGA-3 PO) Take by mouth.     omeprazole  (PRILOSEC) 20 MG capsule TAKE ONE CAPSULE BY MOUTH DAILY 60 capsule 3   OZEMPIC , 2 MG/DOSE, 8 MG/3ML SOPN Inject 2 mg as directed once a week. 3 mL 2   tamsulosin  (FLOMAX ) 0.4 MG CAPS capsule TAKE ONE CAPSULE BY MOUTH DAILY 90 capsule 1   traZODone  (DESYREL ) 100 MG tablet Take 1 tablet (100 mg total) by mouth at bedtime. 90 tablet 0   valsartan  (DIOVAN ) 160 MG tablet TAKE ONE TABLET BY MOUTH DAILY 60 tablet 2   No current facility-administered medications for this visit.    Allergies as of 11/25/2023   (No Known Allergies)    Family History  Problem Relation Age of Onset   Kidney disease Mother    Diabetes Mother    Liver disease Mother    Stroke Father    Diabetes Father    Arthritis Father    Heart disease Father    Hypertension Father     Hyperlipidemia Father    Heart attack Father    GI Bleed Father    Hypertension Brother    Hyperlipidemia Brother    Colon cancer Maternal Grandfather    Heart disease Maternal Grandfather    Hypertension Maternal Grandfather    Hyperlipidemia Maternal Grandfather    Heart attack Maternal Grandfather    Diverticulitis Maternal Grandfather    Emphysema Paternal Grandmother    Autism Daughter    Anxiety disorder Daughter    Depression Daughter    Autism Daughter    Anxiety disorder Daughter    Depression Daughter    Anxiety disorder Daughter    Depression Daughter     Social History   Tobacco Use  Smoking status: Former    Current packs/day: 0.00    Average packs/day: 0.5 packs/day for 7.0 years (3.5 ttl pk-yrs)    Types: Cigarettes    Start date: 2004    Quit date: 2011    Years since quitting: 14.8   Smokeless tobacco: Current    Types: Chew  Vaping Use   Vaping status: Never Used  Substance Use Topics   Alcohol use: Not Currently    Alcohol/week: 49.0 standard drinks of alcohol    Types: 49 Shots of liquor per week    Comment: quit in 2024   Drug use: Not Currently     Review of Systems:    Constitutional: No weight loss, fever, chills, weakness or fatigue Eyes: No change in vision Ears, Nose, Throat:  No change in hearing or congestion Skin: No rash or itching Cardiovascular: No chest pain, chest pressure or palpitations   Respiratory: No SOB or cough Gastrointestinal: See HPI and otherwise negative Genitourinary: No dysuria or change in urinary frequency Neurological: No headache, dizziness or syncope Musculoskeletal: No new muscle or joint pain Hematologic: No bleeding or bruising    Physical Exam:  Vital signs: There were no vitals taken for this visit.  Constitutional: NAD, Well developed, Well nourished, alert and cooperative Head:  Normocephalic and atraumatic.  Eyes: No scleral icterus. Conjunctiva pink. Mouth: No oral  lesions. Respiratory: Respirations even and unlabored. Lungs clear to auscultation bilaterally.  No wheezes, crackles, or rhonchi.  Cardiovascular:  Regular rate and rhythm. No murmurs. No peripheral edema. Gastrointestinal:  Soft, nondistended, nontender. No rebound or guarding. Normal bowel sounds. No appreciable masses or hepatomegaly. Rectal:  Not performed.  Neurologic:  Alert and oriented x4;  grossly normal neurologically.  Skin:   Dry and intact without significant lesions or rashes. Psychiatric: Oriented to person, place and time. Demonstrates good judgement and reason without abnormal affect or behaviors.   RELEVANT LABS AND IMAGING: CBC    Component Value Date/Time   WBC 7.3 09/04/2023 1425   RBC 5.37 09/04/2023 1425   HGB 13.9 09/04/2023 1425   HCT 43.7 09/04/2023 1425   PLT 203.0 09/04/2023 1425   MCV 81.3 09/04/2023 1425   MCHC 31.8 09/04/2023 1425   RDW 15.2 09/04/2023 1425   LYMPHSABS 2.4 09/04/2023 1425   MONOABS 0.7 09/04/2023 1425   EOSABS 0.5 09/04/2023 1425   BASOSABS 0.1 09/04/2023 1425    CMP     Component Value Date/Time   NA 141 09/04/2023 1425   NA 140 01/17/2021 0000   K 4.2 09/04/2023 1425   CL 107 09/04/2023 1425   CO2 28 09/04/2023 1425   GLUCOSE 93 09/04/2023 1425   BUN 12 09/04/2023 1425   BUN 13 01/17/2021 0000   CREATININE 0.88 09/04/2023 1425   CALCIUM 9.5 09/04/2023 1425   PROT 7.1 09/04/2023 1425   ALBUMIN 3.9 09/04/2023 1425   AST 47 (H) 09/04/2023 1425   ALT 53 09/04/2023 1425   ALKPHOS 38 (L) 09/04/2023 1425   BILITOT 0.3 09/04/2023 1425   Echocardiogram 10/02/2018 1. The left ventricle has normal systolic function with an ejection fraction of 60- 65% . The cavity size was normal. Left ventricular diastolic Doppler parameters are consistent with pseudonormalization.  2. The right ventricle has normal systolic function. The cavity was normal. There is no increase in right ventricular wall thickness.  3. The mitral valve is  grossly normal.  4. The tricuspid valve is grossly normal.  5. The aorta is normal unless otherwise  noted.  6. There is mild dilatation of the ascending aorta measuring 38 mm.  Assessment/Plan:   Manage cirrhosis with follow ups every 6 months, HCC screening every 6 months Needs AFP and Vit D today Needs EGD at a minimum, due for colonoscopy next year or could do it now given iron deficiency    Camie Furbish, PA-C [facility] 11/24/2023, 4:32 PM  Patient Care Team: Nedra Tinnie LABOR, NP as PCP - General (Internal Medicine)

## 2023-11-25 ENCOUNTER — Ambulatory Visit (INDEPENDENT_AMBULATORY_CARE_PROVIDER_SITE_OTHER): Admitting: Gastroenterology

## 2023-11-25 ENCOUNTER — Other Ambulatory Visit

## 2023-11-25 ENCOUNTER — Encounter: Payer: Self-pay | Admitting: Gastroenterology

## 2023-11-25 VITALS — BP 110/78 | HR 86 | Ht 76.0 in | Wt 379.4 lb

## 2023-11-25 DIAGNOSIS — K760 Fatty (change of) liver, not elsewhere classified: Secondary | ICD-10-CM

## 2023-11-25 DIAGNOSIS — E611 Iron deficiency: Secondary | ICD-10-CM

## 2023-11-25 DIAGNOSIS — K7460 Unspecified cirrhosis of liver: Secondary | ICD-10-CM

## 2023-11-25 DIAGNOSIS — R748 Abnormal levels of other serum enzymes: Secondary | ICD-10-CM

## 2023-11-25 DIAGNOSIS — Z8379 Family history of other diseases of the digestive system: Secondary | ICD-10-CM

## 2023-11-25 LAB — VITAMIN D 25 HYDROXY (VIT D DEFICIENCY, FRACTURES): VITD: 27.68 ng/mL — ABNORMAL LOW (ref 30.00–100.00)

## 2023-11-25 MED ORDER — NA SULFATE-K SULFATE-MG SULF 17.5-3.13-1.6 GM/177ML PO SOLN: 1.0000 | Freq: Once | ORAL | 0 refills | Status: AC

## 2023-11-25 NOTE — Patient Instructions (Addendum)
 You have been scheduled for an endoscopy and colonoscopy. Please follow the written instructions given to you at your visit today.  Your provider has requested that you go to the basement level for lab work before leaving today. Press B on the elevator. The lab is located at the first door on the left as you exit the elevator.   Due to recent changes in healthcare laws, you may see the results of your imaging and laboratory studies on MyChart before your provider has had a chance to review them.  We understand that in some cases there may be results that are confusing or concerning to you. Not all laboratory results come back in the same time frame and the provider may be waiting for multiple results in order to interpret others.  Please give us  48 hours in order for your provider to thoroughly review all the results before contacting the office for clarification of your results.      If you use inhalers (even only as needed), please bring them with you on the day of your procedure.  DO NOT TAKE 7 DAYS PRIOR TO TEST- Trulicity (dulaglutide) Ozempic , Wegovy  (semaglutide ) Mounjaro (tirzepatide) Bydureon Bcise (exanatide extended release)  DO NOT TAKE 1 DAY PRIOR TO YOUR TEST Rybelsus  (semaglutide ) Adlyxin (lixisenatide) Victoza (liraglutide) Byetta (exanatide) ___________________________________________________________________________   _______________________________________________________  If your blood pressure at your visit was 140/90 or greater, please contact your primary care physician to follow up on this.  _______________________________________________________  If you are age 44 or older, your body mass index should be between 23-30. Your Body mass index is 46.18 kg/m. If this is out of the aforementioned range listed, please consider follow up with your Primary Care Provider.  If you are age 54 or younger, your body mass index should be between 19-25. Your Body mass index  is 46.18 kg/m. If this is out of the aformentioned range listed, please consider follow up with your Primary Care Provider.   ________________________________________________________  The [facility] providers would like to encourage you to use MYCHART to communicate with providers for non-urgent requests or questions.  Due to long hold times on the telephone, sending your provider a message by [facility] may be a faster and more efficient way to get a response.  Please allow 48 business hours for a response.  Please remember that this is for non-urgent requests.  _______________________________________________________  [facility] is using a team-based approach to care.  Your team is made up of your doctor and two to three APPS. Our APPS (Nurse Practitioners and Physician Assistants) work with your physician to ensure care continuity for you. They are fully qualified to address your health concerns and develop a treatment plan. They communicate directly with your gastroenterologist to care for you. Seeing the Advanced Practice Practitioners on your physician's team can help you by facilitating care more promptly, often allowing for earlier appointments, access to diagnostic testing, procedures, and other specialty referrals.    Due to recent changes in healthcare laws, you may see the results of your imaging and laboratory studies on MyChart before your provider has had a chance to review them.  We understand that in some cases there may be results that are confusing or concerning to you. Not all laboratory results come back in the same time frame and the provider may be waiting for multiple results in order to interpret others.  Please give us  48 hours in order for your provider to thoroughly review all the results before contacting  the office for clarification of your results.    Follow a 2 g sodium diet. Handout provided.  Avoid alcohol.    I appreciate the opportunity to care for  you. Camie Furbish, PA

## 2023-11-26 ENCOUNTER — Telehealth: Payer: Self-pay | Admitting: Gastroenterology

## 2023-11-26 ENCOUNTER — Other Ambulatory Visit

## 2023-11-26 ENCOUNTER — Encounter: Payer: Self-pay | Admitting: Gastroenterology

## 2023-11-26 NOTE — Telephone Encounter (Signed)
 Please follow up with patient regarding Hep B vaccination. I believe he wanted to do this with his PCP, but can have it with us  if not done yet.  Thank you

## 2023-11-26 NOTE — Progress Notes (Signed)
 Attending Physician's Attestation   I have reviewed the chart.   I agree with the Advanced Practitioner's note, impression, and recommendations with any updates as below.    Corliss Parish, MD [facility] Office # [phone]

## 2023-11-27 ENCOUNTER — Ambulatory Visit (INDEPENDENT_AMBULATORY_CARE_PROVIDER_SITE_OTHER)

## 2023-11-27 ENCOUNTER — Ambulatory Visit: Payer: Self-pay | Admitting: Gastroenterology

## 2023-11-27 DIAGNOSIS — Z23 Encounter for immunization: Secondary | ICD-10-CM

## 2023-11-27 DIAGNOSIS — F331 Major depressive disorder, recurrent, moderate: Secondary | ICD-10-CM | POA: Diagnosis not present

## 2023-11-27 DIAGNOSIS — F900 Attention-deficit hyperactivity disorder, predominantly inattentive type: Secondary | ICD-10-CM | POA: Diagnosis not present

## 2023-11-27 DIAGNOSIS — F411 Generalized anxiety disorder: Secondary | ICD-10-CM | POA: Diagnosis not present

## 2023-11-27 LAB — AFP TUMOR MARKER: AFP-Tumor Marker: 9.9 ng/mL — ABNORMAL HIGH (ref <6.1)

## 2023-11-27 NOTE — Progress Notes (Signed)
 After obtaining informed consent, the immunization 2nd Hep B vaccine was given by Flonnie Norse. Ordered by Tinnie Harada, NP.

## 2023-11-28 NOTE — Telephone Encounter (Signed)
 Hello,   He did the Heplisav at his PCP: 10/22/2023 and 11/27/2023.

## 2023-11-29 DIAGNOSIS — F900 Attention-deficit hyperactivity disorder, predominantly inattentive type: Secondary | ICD-10-CM | POA: Diagnosis not present

## 2023-11-29 DIAGNOSIS — F840 Autistic disorder: Secondary | ICD-10-CM | POA: Diagnosis not present

## 2023-11-29 DIAGNOSIS — F331 Major depressive disorder, recurrent, moderate: Secondary | ICD-10-CM | POA: Diagnosis not present

## 2023-11-29 DIAGNOSIS — F411 Generalized anxiety disorder: Secondary | ICD-10-CM | POA: Diagnosis not present

## 2023-12-10 ENCOUNTER — Other Ambulatory Visit: Payer: Self-pay | Admitting: Nurse Practitioner

## 2023-12-10 DIAGNOSIS — E119 Type 2 diabetes mellitus without complications: Secondary | ICD-10-CM

## 2023-12-10 NOTE — Telephone Encounter (Signed)
 Requesting: Ozempic  2 mg/dose (8 mg/3 mL) subcutaneous pen injector  Last Visit: 10/22/2023 Next Visit: 01/21/2024 Last Refill: 09/11/2023  Please Advise

## 2023-12-13 ENCOUNTER — Other Ambulatory Visit

## 2023-12-13 DIAGNOSIS — F840 Autistic disorder: Secondary | ICD-10-CM | POA: Diagnosis not present

## 2023-12-13 DIAGNOSIS — Z125 Encounter for screening for malignant neoplasm of prostate: Secondary | ICD-10-CM | POA: Diagnosis not present

## 2023-12-13 DIAGNOSIS — F900 Attention-deficit hyperactivity disorder, predominantly inattentive type: Secondary | ICD-10-CM | POA: Diagnosis not present

## 2023-12-13 DIAGNOSIS — F411 Generalized anxiety disorder: Secondary | ICD-10-CM | POA: Diagnosis not present

## 2023-12-13 DIAGNOSIS — F331 Major depressive disorder, recurrent, moderate: Secondary | ICD-10-CM | POA: Diagnosis not present

## 2023-12-13 DIAGNOSIS — E236 Other disorders of pituitary gland: Secondary | ICD-10-CM | POA: Diagnosis not present

## 2023-12-14 LAB — PSA: PSA: 0.76 ng/mL (ref <=4.00)

## 2023-12-14 LAB — IRON,TIBC AND FERRITIN PANEL
%SAT: 13 % — ABNORMAL LOW (ref 20–48)
Ferritin: 73 ng/mL (ref 38–380)
Iron: 41 ug/dL — ABNORMAL LOW (ref 50–180)
TIBC: 325 ug/dL (ref 250–425)

## 2023-12-15 ENCOUNTER — Other Ambulatory Visit: Payer: Self-pay | Admitting: Nurse Practitioner

## 2023-12-17 NOTE — Telephone Encounter (Signed)
 Requesting: meloxicam  15 mg tablet  Last Visit: 10/22/2023 Next Visit: 01/21/2024 Last Refill: 08/28/2023  Please Advise

## 2023-12-18 DIAGNOSIS — F411 Generalized anxiety disorder: Secondary | ICD-10-CM | POA: Diagnosis not present

## 2023-12-18 DIAGNOSIS — F331 Major depressive disorder, recurrent, moderate: Secondary | ICD-10-CM | POA: Diagnosis not present

## 2023-12-18 DIAGNOSIS — F900 Attention-deficit hyperactivity disorder, predominantly inattentive type: Secondary | ICD-10-CM | POA: Diagnosis not present

## 2023-12-19 LAB — TESTOSTERONE, FREE & TOTAL
Free Testosterone: 8.6 pg/mL — ABNORMAL LOW (ref 35.0–155.0)
Testosterone, Total, LC-MS-MS: 85 ng/dL — ABNORMAL LOW (ref 250–1100)

## 2023-12-21 LAB — PROLACTIN, TOTAL AND MONOMERIC
Prolactin, Monomeric: 67.4 ng/mL — ABNORMAL HIGH (ref 3.4–14.8)
Prolactin, Total: 87 ng/mL — ABNORMAL HIGH (ref 2.0–18.0)

## 2023-12-21 LAB — PROLACTIN: Prolactin: 94.1 ng/mL — ABNORMAL HIGH (ref 2.0–18.0)

## 2023-12-21 LAB — T4, FREE: Free T4: 1 ng/dL (ref 0.8–1.8)

## 2023-12-21 LAB — ACTH: C206 ACTH: 98 pg/mL — ABNORMAL HIGH (ref 6–50)

## 2023-12-21 LAB — SEX HORMONE BINDING GLOBULIN: Sex Hormone Binding: 59 nmol/L — ABNORMAL HIGH (ref 10–50)

## 2023-12-21 LAB — INSULIN-LIKE GROWTH FACTOR
IGF-I, LC/MS: 55 ng/mL (ref 52–328)
Z-Score (Male): -1.9 {STDV} (ref ?–2.0)

## 2023-12-21 LAB — CORTISOL: Cortisol, Plasma: 22.4 ug/dL — ABNORMAL HIGH

## 2023-12-25 ENCOUNTER — Ambulatory Visit: Payer: Self-pay | Admitting: Endocrinology

## 2023-12-27 DIAGNOSIS — F331 Major depressive disorder, recurrent, moderate: Secondary | ICD-10-CM | POA: Diagnosis not present

## 2023-12-27 DIAGNOSIS — F900 Attention-deficit hyperactivity disorder, predominantly inattentive type: Secondary | ICD-10-CM | POA: Diagnosis not present

## 2023-12-27 DIAGNOSIS — F411 Generalized anxiety disorder: Secondary | ICD-10-CM | POA: Diagnosis not present

## 2023-12-27 DIAGNOSIS — F840 Autistic disorder: Secondary | ICD-10-CM | POA: Diagnosis not present

## 2024-01-03 ENCOUNTER — Encounter: Payer: Self-pay | Admitting: Endocrinology

## 2024-01-03 ENCOUNTER — Ambulatory Visit: Admitting: Endocrinology

## 2024-01-03 VITALS — BP 122/80 | HR 81 | Resp 16 | Ht 76.0 in | Wt 386.6 lb

## 2024-01-03 DIAGNOSIS — N62 Hypertrophy of breast: Secondary | ICD-10-CM

## 2024-01-03 DIAGNOSIS — R7989 Other specified abnormal findings of blood chemistry: Secondary | ICD-10-CM

## 2024-01-03 DIAGNOSIS — E230 Hypopituitarism: Secondary | ICD-10-CM

## 2024-01-03 DIAGNOSIS — E221 Hyperprolactinemia: Secondary | ICD-10-CM

## 2024-01-03 DIAGNOSIS — E236 Other disorders of pituitary gland: Secondary | ICD-10-CM

## 2024-01-03 MED ORDER — SYRINGE 23G X 1" 3 ML MISC: 1 refills | Status: AC

## 2024-01-03 MED ORDER — TESTOSTERONE CYPIONATE 200 MG/ML IM SOLN: 200.0000 mg | Freq: Once | INTRAMUSCULAR | 1 refills | Status: DC

## 2024-01-03 NOTE — Progress Notes (Signed)
 Outpatient Endocrinology Note Genesis Novosad, MD   Patient's Name: Peter Gutierrez    DOB: Jul 15, 1980    MRN: [id number]  REASON OF VISIT: Follow-up for hypogonadism / hyperprolactinemia  REFERRING PROVIDER: Nedra Tinnie LABOR, NP  PCP:  Nedra Tinnie LABOR, NP  HISTORY OF PRESENT ILLNESS:   Peter Gutierrez is a 43 y.o. 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  Pertinent Hx: Patient was referred to endocrinology for evaluation and management of hyperprolactinemia and low testosterone .  Laboratory test were done in April 2025.  Initial consult on November 20, 2023.  Background: - Patient had complaints of nipple pain, was evaluated by primary care provider in March 2025.  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  Patient had MRI pituitary in July 28, 2023 showed no pituitary mass or lesion identified.  On my review has partially empty sella.  Mammogram in April 30, 2023 showed mild bilateral benign gynecomastia.  No evidence of malignancy in the bilateral breast.  Patient has major depressive disorder, following with psychiatry, [facility], on mental health medication including Latuda /lurasidone.  Other mental health medications Cymbalta , Wellbutrin .  Patient has complaints of significantly low energy no motivation to do things, significant erectile dysfunction.  He reports he has fair libido.  He denies breast discharge.  No new headache.  No peripheral vision loss.  Labs : Other laboratory evaluation he had normal thyroid  function test.  He has normal renal function.  He had elevated liver enzymes, improving.  He has  hepatic steatosis.  Peter Gutierrez has been following with gastroenterology.  Libido:                      Fair  erectile Dysfunction: Yes Gynecomastia:           No Fathered any child:     Yes, 4 biological, grown up.  Shaving less often:     No Opioid use:                  No Decreased strength    No Decreased Muscle      No Tiredness/Fatigue       Yes Anabolic steroids:        No Weight lifting:              No Excessive exercise:    No Fractures:                    No Vision issues               No Sense of Smell           Intact Truama, Radiation, Mumps  No  # Type 2 diabetes mellitus controlled with hemoglobin A1c of 5.7% in September 2025, on Ozempic  and metformin , managed by PCP.  # Patient was diagnosed with obstructive sleep apnea several years ago, had recheck of sleep study around November 2025 and using CPAP.Peter Gutierrez  Interval history Patient had lab last month significantly low testosterone  level consistent with hypogonadotropic hypogonadism.  Normal thyroid  function test.  Prolactin is still elevated.  He has iron deficiency, on iron supplement, managed by PCP.  He has mildly elevated serum cortisol and  ACTH , he reports he is on a lot of his stress, no obvious features of hypercortisolism.  Patient reports he was retested for sleep study in [place], diagnosed with sleep apnea and has been using CPAP.  He has complaints of low energy, low motivation and erectile dysfunction.  He wants to start testosterone  therapy.  No breast tissue, galactorrhea.  No new headache.  Recent laboratory results completed on November 14 reviewed.   Latest Reference Range & Units 12/13/23 09:07 12/13/23 09:10  C206 ACTH  6 - 50 pg/mL 98 (H)   Cortisol, Plasma mcg/dL 77.5 (H)   Prolactin 2.0 - 18.0 ng/mL 94.1 (H)   Free Testosterone  35.0 - 155.0 pg/mL  8.6 (L)  Sex Horm Binding Glob, Serum 10 - 50 nmol/L 59 (H)   Testosterone , Total, LC-MS-MS 250 - 1,100 ng/dL  85 (L)  U5,Qmzz(Ipmzru) 0.8 - 1.8 ng/dL  1.0   (H): Data is abnormally high (L): Data is abnormally low  Patient brought letter from his psychiatrist [facility], he has been on Latuda and aripiprazole, wanted to continue the same medications for stabilization of his mental health.  REVIEW OF SYSTEMS:  As per history of present illness.   PAST MEDICAL HISTORY: Past Medical History:  Diagnosis Date   ADHD    Autistic disorder    Depression    DM (diabetes mellitus) (HCC)    Elevated liver function tests    Fatty liver    Gallstones    Gynecomastia    Hyperprolactinemia    Hypertension    Hypogonadotropic hypogonadism    Kidney stone    Morbid obesity (HCC)    PONV (postoperative nausea and vomiting)    Prediabetes    Sinus tachycardia    Sleep apnea    CPAP    PAST SURGICAL HISTORY: Past Surgical History:  Procedure Laterality Date   CHOLECYSTECTOMY     EXTRACORPOREAL SHOCK WAVE LITHOTRIPSY Right 07/27/2019   Procedure: EXTRACORPOREAL SHOCK WAVE LITHOTRIPSY (ESWL);  Surgeon: Renda Glance, MD;  Location: [facility];  Service: Urology;  Laterality: Right;   SMALL INTESTINE SURGERY     about age 45 months or 3 weeks    ALLERGIES: No Known Allergies  FAMILY HISTORY:  Family History  Problem Relation Age of Onset   Kidney disease Mother    Diabetes Mother    Liver disease Mother    Stroke Father    Diabetes Father    Arthritis Father    Heart disease Father    Hypertension Father    Hyperlipidemia Father    Heart attack Father    GI Bleed Father    Hypertension Brother    Hyperlipidemia Brother    Colon cancer Maternal Grandfather    Heart disease Maternal Grandfather    Hypertension Maternal Grandfather    Hyperlipidemia Maternal Grandfather    Heart attack Maternal Grandfather    Diverticulitis Maternal Grandfather    Emphysema Paternal Grandmother    Autism Daughter    Anxiety disorder Daughter    Depression Daughter    Autism Daughter    Anxiety disorder  Daughter    Depression Daughter    Anxiety disorder Daughter    Depression Daughter     SOCIAL HISTORY: Social History   Socioeconomic History   Marital status: Married    Spouse name: Not on file   Number of children: 3   Years of education: Not on file   Highest education level: Associate degree: occupational, scientist, product/process development, or vocational program  Occupational History   Not on file  Tobacco Use   Smoking status: Former    Current packs/day: 0.00    Average packs/day: 0.5 packs/day for 7.0 years (3.5 ttl pk-yrs)    Types: Cigarettes    Start date: 2004    Quit date: 2011    Years since quitting: 14.9   Smokeless tobacco: Current    Types: Chew  Vaping Use   Vaping status: Never Used  Substance and Sexual Activity   Alcohol use: Not Currently    Alcohol/week: 49.0 standard drinks of alcohol    Types: 49 Shots of liquor per week    Comment: quit in 2024   Drug use: Not Currently   Sexual activity: Yes  Other Topics Concern   Not on file  Social History Narrative   Not on file   Social Drivers of Health   Financial Resource Strain: Medium Risk (07/22/2023)   Overall Financial Resource Strain (CARDIA)    Difficulty of Paying Living Expenses: Somewhat hard  Food Insecurity: No Food Insecurity (07/22/2023)   Hunger Vital Sign    Worried About Running Out of Food in the Last Year: Never true    Ran Out of Food in the Last Year: Never true  Transportation Needs: No Transportation Needs (07/22/2023)   PRAPARE - Administrator, Civil Service (Medical): No    Lack of Transportation (Non-Medical): No  Physical Activity: Insufficiently Active (07/22/2023)   Exercise Vital Sign    Days of Exercise per Week: 1 day    Minutes of Exercise per Session: 10 min  Stress: No Stress Concern Present (07/22/2023)   [employer] of Occupational Health - Occupational Stress Questionnaire    Feeling of Stress: Only a little  Social Connections: Moderately Integrated  (07/22/2023)   Social Connection and Isolation Panel    Frequency of Communication with Friends and Family: Twice a week    Frequency of Social Gatherings with Friends and Family: Once a week    Attends Religious Services: More than 4 times per year    Active Member of [employer] or Organizations: No    Attends Engineer, Structural: Not on file    Marital Status: Married    MEDICATIONS:  Current Outpatient Medications  Medication Sig Dispense Refill   albuterol  (VENTOLIN  HFA) 108 (90 Base) MCG/ACT inhaler Inhale 1-2 puffs into the lungs every 6 (six) hours as needed for wheezing or shortness of breath. 8 g 0   ALPRAZolam  (XANAX ) 0.5 MG tablet Take 1 tablet (0.5 mg total) by mouth 2 (two) times daily as needed for anxiety. 60 tablet 0   amphetamine-dextroamphetamine (ADDERALL XR) 25 MG 24 hr capsule Take 25 mg by mouth every morning.     aspirin 325 MG tablet Take 325 mg by mouth daily.     buPROPion  (WELLBUTRIN  XL) 300 MG 24 hr tablet Take 1 tablet (300 mg total) by mouth daily. 90 tablet 0   cloNIDine  (CATAPRES ) 0.1 MG tablet Take 1 tablet (0.1 mg total) by mouth at bedtime. 90 tablet 1   Clotrimazole  1 % OINT Apply 1 application. topically daily. 56.7 g 2   DULoxetine  (CYMBALTA ) 60 MG capsule Take 60 mg by mouth at bedtime.     ferrous sulfate 325 (65 FE) MG tablet Take 325 mg by mouth daily with breakfast.     lurasidone (LATUDA) 40 MG TABS tablet Take 40 mg by mouth daily with breakfast.     meloxicam  (MOBIC ) 15 MG tablet TAKE  ONE TABLET BY MOUTH DAILY AS NEEDED 90 tablet 0   metFORMIN  (GLUCOPHAGE -XR) 500 MG 24 hr tablet Take 1 tablet (500 mg total) by mouth daily with breakfast. 90 tablet 1   metoprolol  succinate (TOPROL -XL) 100 MG 24 hr tablet Take 1 tablet by mouth daily. Take with or immediately following a meal. 90 tablet 1   Multiple Vitamin (MULTIVITAMIN ADULT PO) Take by mouth.     Omega-3 Fatty Acids (FISH OIL OMEGA-3 PO) Take by mouth.     omeprazole  (PRILOSEC) 20 MG  capsule TAKE ONE CAPSULE BY MOUTH DAILY 60 capsule 3   OZEMPIC , 2 MG/DOSE, 8 MG/3ML SOPN Inject 2 mg as directed once a week. 3 mL 2   Syringe/Needle, Disp, (SYRINGE 3CC/23GX1) 23G X 1 3 ML MISC Use once every 2 weeks to inject testosterone  50 each 1   tamsulosin  (FLOMAX ) 0.4 MG CAPS capsule TAKE ONE CAPSULE BY MOUTH DAILY 90 capsule 1   testosterone  cypionate (DEPOTESTOSTERONE CYPIONATE) 200 MG/ML injection Inject 1 mL (200 mg total) into the muscle once for 1 dose. 10 mL 1   traZODone  (DESYREL ) 100 MG tablet Take 1 tablet (100 mg total) by mouth at bedtime. 90 tablet 0   valsartan  (DIOVAN ) 160 MG tablet TAKE ONE TABLET BY MOUTH DAILY 60 tablet 2   No current facility-administered medications for this visit.    PHYSICAL EXAM: Vitals:   01/03/24 1046  BP: 122/80  Pulse: 81  Resp: 16  SpO2: 97%  Weight: (!) 386 lb 9.6 oz (175.4 kg)  Height: 6' 4 (1.93 m)    Body mass index is 47.06 kg/m.  Wt Readings from Last 3 Encounters:  01/03/24 (!) 386 lb 9.6 oz (175.4 kg)  11/25/23 (!) 379 lb 6 oz (172.1 kg)  11/20/23 (!) 384 lb (174.2 kg)    General: Well developed, well nourished male in no apparent distress. Non-Cushingoid. No obvious Klinefelter's syndrome body habitus HEENT: AT/[place], no external lesions. Hearing intact to the spoken word Eyes: EOMI. No proptosis, stare and lid lag. Conjunctiva clear and no icterus. Visual field grossly intact in confrontation Neck: Trachea midline, neck supple without appreciable thyromegaly or lymphadenopathy and no palpable thyroid  nodules Abdomen: Soft, non tender, non distended.  Stretch marks present.  No striae. Neurologic: Alert, oriented, normal speech, deep tendon biceps reflexes normal,  no gross focal neurological deficit. No obvious proximal weakness Extremities: No pedal pitting edema, no tremors of outstretched hands, no excessive hair growth Skin: Warm, color good.  Breast: Bilateral gynecomastia present, nontender, no discharge.    PERTINENT HISTORIC LABORATORY AND IMAGING STUDIES:  All pertinent laboratory results were reviewed. Please see HPI also for further details.     Latest Reference Range & Units 04/19/23 08:57  Sex Horm Binding Glob, Serum 10 - 50 nmol/L 52 (H)  Testosterone  300.00 - 890.00 ng/dL 05.20 (L)  (H): Data is abnormally high (L): Data is abnormally low   Latest Reference Range & Units 05/10/23 08:27  LH 1.5 - 9.3 mIU/mL 1.4 (L)  FSH 1.4 - 12.8 mIU/mL 2.4  Prolactin 2.0 - 18.0 ng/mL 83.4 (H)  Testosterone  264 - 916 ng/dL 857 (L)  Testosterone  Free 6.8 - 21.5 pg/mL 3.0 (L)  (L): Data is abnormally low (H): Data is abnormally high   Latest Reference Range & Units 09/04/23 14:25  TSH 0.35 - 5.50 uIU/mL 1.04    ASSESSMENT / PLAN  1. Hypogonadotropic hypogonadism   2. Elevated morning serum cortisol level   3. Hyperprolactinemia   4. Empty  sella   5. Gynecomastia, male    Patient has hypogonadotropic hypogonadism : - Significantly low testosterone  level.  May have been aggravated by hyperprolactinemia.  He also has partial empty sella.  He had MRI pituitary in June 2025 with no pituitary mass however had partial empty sella on my review.  He has obstructive sleep apnea, on CPAP, treatment recently started.  No plan to treat for hyperprolactinemia due to being on antipsychotic medications.  Plan: - Discussed options of testosterone  therapy including injectable versus topical.  He prefers to be on injectable. - Start testosterone  cypionate 200 mg every 2 weeks.  Prescription sent. - Patient asked to call our clinic after he is able to refill testosterone , to have nurse visit for testosterone  injection for the first time. - Check total testosterone  prior to follow-up visit in 3 months.   # He has bilateral small gynecomastia likely related to hypogonadism /and hyperprolactinemia. # He has mildly elevated serum cortisol and ACTH .  He has no obvious features of hypercortisolism.  Will  continue to monitor.  He reports he has been on a lot of stress lately.  # Hyperprolactinemia : - Patient has hyperprolactinemia prolactin 80-90 range.  Likely related to antipsychotic medication currently on Latuda and aripiprazole. - Received letter from his psychiatrist,  [facility], he has been on Latuda and aripiprazole, wanted to continue the same medications for stabilization of his mental health.  Plan: - No plan to treat for hyperprolactinemia with dopamine agonist medication, as a tool endocrinologist is antipsychotic medications. -Continue to monitor prolactin intermittently.  Diagnoses and all orders for this visit:  Hypogonadotropic hypogonadism -     testosterone  cypionate (DEPOTESTOSTERONE CYPIONATE) 200 MG/ML injection; Inject 1 mL (200 mg total) into the muscle once for 1 dose. -     Syringe/Needle, Disp, (SYRINGE 3CC/23GX1) 23G X 1 3 ML MISC; Use once every 2 weeks to inject testosterone  -     Testosterone , Total, LC/MS/MS  Elevated morning serum cortisol level -     Cortisol -     ACTH   Hyperprolactinemia  Empty sella  Gynecomastia, male    DISPOSITION Follow up in clinic in 3-4 suggested.  Labs prior to follow-up visit as ordered.   All questions answered and patient verbalized understanding of the plan.  Huntley Knoop, MD [facility] Endocrinology [facility] [address] [place], [address] [place], [place] [address] Phone # [phone]  At least part of this note was generated using voice recognition software. Inadvertent word errors may have occurred, which were not recognized during the proofreading process.

## 2024-01-10 DIAGNOSIS — F411 Generalized anxiety disorder: Secondary | ICD-10-CM | POA: Diagnosis not present

## 2024-01-10 DIAGNOSIS — F900 Attention-deficit hyperactivity disorder, predominantly inattentive type: Secondary | ICD-10-CM | POA: Diagnosis not present

## 2024-01-10 DIAGNOSIS — F331 Major depressive disorder, recurrent, moderate: Secondary | ICD-10-CM | POA: Diagnosis not present

## 2024-01-10 DIAGNOSIS — F840 Autistic disorder: Secondary | ICD-10-CM | POA: Diagnosis not present

## 2024-01-14 ENCOUNTER — Telehealth: Payer: Self-pay | Admitting: Endocrinology

## 2024-01-14 NOTE — Telephone Encounter (Signed)
 Patient called this morning asking if he needs a nurse visit,it's regarding testosterone  injections.He requested a call back,please advise.

## 2024-01-14 NOTE — Telephone Encounter (Signed)
 Patient's wife is a teacher, early years/pre and wants to know is she could give him the injections?

## 2024-01-21 ENCOUNTER — Ambulatory Visit: Admitting: Nurse Practitioner

## 2024-01-21 ENCOUNTER — Encounter: Admitting: Gastroenterology

## 2024-01-21 NOTE — Progress Notes (Signed)
 "  Established Patient Office Visit  Subjective   Patient ID: Peter Gutierrez, male    DOB: 07/25/1980  Age: 43 y.o. MRN: [id number]  Chief Complaint  Patient presents with   Diabetes    Follow up, no concerns    HPI Discussed the use of AI scribe software for clinical note transcription with the patient, who gave verbal consent to proceed.  History of Present Illness   Peter Gutierrez is a 43 year old male with type 2 diabetes who presents for follow-up regarding medication intolerance and liver health.  He discontinued Ozempic  three weeks ago due to severe daily gastrointestinal side effects described as explosive diarrhea. His gastrointestinal symptoms resolved after stopping it. He previously tried Wegovy  without benefit and took Trulicity once before. He denies chest pain, shortness of breath, and neuropathy.   He has liver problems and recently saw a gastroenterologist. Cancer markers were elevated on a recent scan, but no lesions were found. He was advised to have an endoscopy and colonoscopy, which he postponed over the holidays. US  elastography concerning for cirrhosis.       ROS See pertinent positives and negatives per HPI.    Objective:     BP 124/88 (BP Location: Right Arm, Patient Position: Sitting, Cuff Size: Large)   Pulse 84   Temp (!) 97.5 F (36.4 C)   Ht 6' 4 (1.93 m)   Wt (!) 390 lb (176.9 kg)   SpO2 95%   BMI 47.47 kg/m  BP Readings from Last 3 Encounters:  01/22/24 124/88  01/03/24 122/80  11/25/23 110/78   Wt Readings from Last 3 Encounters:  01/22/24 (!) 390 lb (176.9 kg)  01/03/24 (!) 386 lb 9.6 oz (175.4 kg)  11/25/23 (!) 379 lb 6 oz (172.1 kg)      Physical Exam Vitals and nursing note reviewed.  Constitutional:      Appearance: Normal appearance.  HENT:     Head: Normocephalic.  Eyes:     Conjunctiva/sclera: Conjunctivae normal.  Cardiovascular:     Rate and Rhythm: Normal rate and regular rhythm.     Pulses: Normal  pulses.     Heart sounds: Normal heart sounds.  Pulmonary:     Effort: Pulmonary effort is normal.     Breath sounds: Normal breath sounds.  Musculoskeletal:     Cervical back: Normal range of motion.  Skin:    General: Skin is warm.  Neurological:     General: No focal deficit present.     Mental Status: He is alert and oriented to person, place, and time.  Psychiatric:        Mood and Affect: Mood normal.        Behavior: Behavior normal.        Thought Content: Thought content normal.        Judgment: Judgment normal.      Results for orders placed or performed in visit on 01/22/24  POCT glycosylated hemoglobin (Hb A1C)  Result Value Ref Range   Hemoglobin A1C 6.1 (A) 4.0 - 5.6 %   HbA1c POC (<> result, manual entry) 6.1 4.0 - 5.6 %   HbA1c, POC (prediabetic range) 6.1 5.7 - 6.4 %   HbA1c, POC (controlled diabetic range) 6.1 0.0 - 7.0 %      Assessment & Plan:   Problem List Items Addressed This Visit       Digestive   Hepatic steatosis   US  elastography concerning for cirrhosis. He saw GI  who recommended colonoscopy and EGD. Continue collaboration and recommendations from GI.         Endocrine   Diabetes mellitus without complication (HCC) - Primary   Chronic, stable. His diabetes is well-controlled with an A1c of 6.1%. Mounjaro  is recommended due to previous intolerance to Ozempic  and Trulicity. Initiated Mounjaro  2.5mg  weekly. Discussed possible side effects. Continue metformin  XR 500mg  daily. Follow-up in 3 months.      Relevant Medications   tirzepatide  (MOUNJARO ) 2.5 MG/0.5ML Pen   Other Relevant Orders   POCT glycosylated hemoglobin (Hb A1C) (Completed)     Other   Morbid obesity (HCC)   Weight management is complicated by intolerance to Ozempic  and Wegovy . Mounjaro  is considered for weight management. Initiated Mounjaro  2.5mg  weekly and encouraged dietary modifications and increased physical activity.       Relevant Medications   tirzepatide   (MOUNJARO ) 2.5 MG/0.5ML Pen   Long term current use of oral hypoglycemic drug   Continue metformin  XR 500mg  daily.       Long-term current use of injectable noninsulin antidiabetic medication   Start mounjaro  2.5mg  weekly        Return in about 3 months (around 04/21/2024) for Diabetes.    Tinnie DELENA Harada, NP  "

## 2024-01-22 ENCOUNTER — Telehealth: Payer: Self-pay | Admitting: Pharmacy Technician

## 2024-01-22 ENCOUNTER — Other Ambulatory Visit (HOSPITAL_COMMUNITY): Payer: Self-pay

## 2024-01-22 ENCOUNTER — Ambulatory Visit (INDEPENDENT_AMBULATORY_CARE_PROVIDER_SITE_OTHER): Admitting: Nurse Practitioner

## 2024-01-22 ENCOUNTER — Other Ambulatory Visit: Payer: Self-pay | Admitting: Endocrinology

## 2024-01-22 ENCOUNTER — Encounter: Payer: Self-pay | Admitting: Nurse Practitioner

## 2024-01-22 VITALS — BP 124/88 | HR 84 | Temp 97.5°F | Ht 76.0 in | Wt 390.0 lb

## 2024-01-22 DIAGNOSIS — Z7985 Long-term (current) use of injectable non-insulin antidiabetic drugs: Secondary | ICD-10-CM | POA: Diagnosis not present

## 2024-01-22 DIAGNOSIS — K760 Fatty (change of) liver, not elsewhere classified: Secondary | ICD-10-CM

## 2024-01-22 DIAGNOSIS — Z7984 Long term (current) use of oral hypoglycemic drugs: Secondary | ICD-10-CM

## 2024-01-22 DIAGNOSIS — E230 Hypopituitarism: Secondary | ICD-10-CM

## 2024-01-22 DIAGNOSIS — E119 Type 2 diabetes mellitus without complications: Secondary | ICD-10-CM | POA: Diagnosis not present

## 2024-01-22 LAB — POCT GLYCOSYLATED HEMOGLOBIN (HGB A1C)
HbA1c POC (<> result, manual entry): 6.1 %
HbA1c, POC (controlled diabetic range): 6.1 % (ref 0.0–7.0)
HbA1c, POC (prediabetic range): 6.1 % (ref 5.7–6.4)
Hemoglobin A1C: 6.1 % — AB (ref 4.0–5.6)

## 2024-01-22 MED ORDER — TIRZEPATIDE 2.5 MG/0.5ML ~~LOC~~ SOAJ: 2.5000 mg | SUBCUTANEOUS | 1 refills | Status: AC

## 2024-01-22 NOTE — Assessment & Plan Note (Signed)
Continue metformin XR 500 mg daily.

## 2024-01-22 NOTE — Assessment & Plan Note (Signed)
 US  elastography concerning for cirrhosis. He saw GI who recommended colonoscopy and EGD. Continue collaboration and recommendations from GI.

## 2024-01-22 NOTE — Telephone Encounter (Signed)
 Pharmacy Patient Advocate Encounter   Received notification from Onbase that prior authorization for Mounjaro  2.5MG /0.5ML auto-injectors  is required/requested.   Insurance verification completed.   The patient is insured through [facility] MEDICAID.   Per test claim: PA required; PA submitted to above mentioned insurance via Latent Key/confirmation #/EOC [id number] Status is pending

## 2024-01-22 NOTE — Assessment & Plan Note (Signed)
 Start mounjaro  2.5mg  weekly

## 2024-01-22 NOTE — Patient Instructions (Signed)
 It was great to see you!  Your sugars are well controlled  Let's switch from ozempic  to mounjaro  once a week. Let me know if you get diarrhea from this   Let's follow-up in 3 months, sooner if you have concerns.  If a referral was placed today, you will be contacted for an appointment. Please note that routine referrals can sometimes take up to 3-4 weeks to process. Please call our office if you haven't heard anything after this time frame.  Take care,  Tinnie Harada, NP

## 2024-01-22 NOTE — Telephone Encounter (Signed)
 Pharmacy Patient Advocate Encounter  Received notification from [facility] MEDICAID that Prior Authorization for Mounjaro  2.5MG /0.5ML auto-injectors has been APPROVED from 01/22/24 to 01/21/25. Ran test claim, Copay is $4.00. This test claim was processed through [facility]- copay amounts may vary at other pharmacies due to pharmacy/plan contracts, or as the patient moves through the different stages of their insurance plan.   PA #/Case ID/Reference #: [id number]

## 2024-01-22 NOTE — Assessment & Plan Note (Signed)
 Weight management is complicated by intolerance to Ozempic  and Wegovy . Mounjaro  is considered for weight management. Initiated Mounjaro  2.5mg  weekly and encouraged dietary modifications and increased physical activity.

## 2024-01-22 NOTE — Assessment & Plan Note (Signed)
 Chronic, stable. His diabetes is well-controlled with an A1c of 6.1%. Mounjaro  is recommended due to previous intolerance to Ozempic  and Trulicity. Initiated Mounjaro  2.5mg  weekly. Discussed possible side effects. Continue metformin  XR 500mg  daily. Follow-up in 3 months.

## 2024-01-24 DIAGNOSIS — F411 Generalized anxiety disorder: Secondary | ICD-10-CM | POA: Diagnosis not present

## 2024-01-27 ENCOUNTER — Other Ambulatory Visit: Payer: Self-pay | Admitting: Nurse Practitioner

## 2024-01-27 NOTE — Telephone Encounter (Signed)
 Requesting: omeprazole  20 mg capsule,delayed release  Last Visit: 01/22/2024 Next Visit: Visit date not found Last Refill: 06/03/2023  Please Advise

## 2024-02-03 DIAGNOSIS — E611 Iron deficiency: Secondary | ICD-10-CM

## 2024-02-03 DIAGNOSIS — R748 Abnormal levels of other serum enzymes: Secondary | ICD-10-CM

## 2024-02-03 DIAGNOSIS — K7460 Unspecified cirrhosis of liver: Secondary | ICD-10-CM

## 2024-02-03 NOTE — Telephone Encounter (Signed)
 Patient's spouse rescheduled ENDOCOL for 03/24/2024. Pt in need of updated prep instructions. Should pt be seen in office first? Please advise thank you.

## 2024-02-03 NOTE — Telephone Encounter (Signed)
 I spoke with Matt's wife Powell and she is coming Wednesday for her procedure. They will pick up the instructions then. They will be on the 3rd floor at the front desk. I told her we can go over any questions he has. Currently he has not started the mounjaro  , hasn't been approved yet. But she is aware that he has to hold it 7 days prior to procedure if he starts it. He already has his prep kit.

## 2024-02-13 ENCOUNTER — Ambulatory Visit (HOSPITAL_BASED_OUTPATIENT_CLINIC_OR_DEPARTMENT_OTHER)

## 2024-02-25 ENCOUNTER — Other Ambulatory Visit: Payer: Self-pay | Admitting: Nurse Practitioner

## 2024-02-25 DIAGNOSIS — E119 Type 2 diabetes mellitus without complications: Secondary | ICD-10-CM

## 2024-03-24 ENCOUNTER — Encounter: Admitting: Gastroenterology

## 2024-03-27 ENCOUNTER — Other Ambulatory Visit

## 2024-04-06 ENCOUNTER — Ambulatory Visit: Admitting: Endocrinology

## 2024-04-14 ENCOUNTER — Ambulatory Visit: Admitting: Nurse Practitioner
